# Patient Record
Sex: Female | Born: 1960 | Race: White | Hispanic: No | Marital: Married | Smoking: Former smoker
Health system: Southern US, Community
[De-identification: ages and names within clinical notes are randomized; demographics above are authoritative.]

## PROBLEM LIST (undated history)

## (undated) DIAGNOSIS — T7840XA Allergy, unspecified, initial encounter: Secondary | ICD-10-CM

## (undated) DIAGNOSIS — D649 Anemia, unspecified: Secondary | ICD-10-CM

## (undated) DIAGNOSIS — D509 Iron deficiency anemia, unspecified: Secondary | ICD-10-CM

## (undated) DIAGNOSIS — N6019 Diffuse cystic mastopathy of unspecified breast: Secondary | ICD-10-CM

## (undated) DIAGNOSIS — J45909 Unspecified asthma, uncomplicated: Secondary | ICD-10-CM

## (undated) HISTORY — DX: Iron deficiency anemia, unspecified: D50.9

## (undated) HISTORY — DX: Anemia, unspecified: D64.9

## (undated) HISTORY — DX: Diffuse cystic mastopathy of unspecified breast: N60.19

## (undated) HISTORY — DX: Unspecified asthma, uncomplicated: J45.909

## (undated) HISTORY — PX: COLONOSCOPY: SHX174

## (undated) HISTORY — PX: WRIST SURGERY: SHX841

## (undated) HISTORY — DX: Allergy, unspecified, initial encounter: T78.40XA

---

## 1998-06-14 ENCOUNTER — Inpatient Hospital Stay (HOSPITAL_COMMUNITY): Admission: AD | Admit: 1998-06-14 | Discharge: 1998-06-16 | Payer: Self-pay | Admitting: Obstetrics and Gynecology

## 1998-06-19 ENCOUNTER — Encounter (HOSPITAL_COMMUNITY): Admission: RE | Admit: 1998-06-19 | Discharge: 1998-09-17 | Payer: Self-pay | Admitting: Obstetrics and Gynecology

## 1998-07-13 ENCOUNTER — Other Ambulatory Visit: Admission: RE | Admit: 1998-07-13 | Discharge: 1998-07-13 | Payer: Self-pay | Admitting: Obstetrics and Gynecology

## 1999-06-25 ENCOUNTER — Other Ambulatory Visit: Admission: RE | Admit: 1999-06-25 | Discharge: 1999-06-25 | Payer: Self-pay | Admitting: Obstetrics and Gynecology

## 1999-07-09 ENCOUNTER — Encounter: Payer: Self-pay | Admitting: Obstetrics and Gynecology

## 1999-07-09 ENCOUNTER — Ambulatory Visit (HOSPITAL_COMMUNITY): Admission: RE | Admit: 1999-07-09 | Discharge: 1999-07-09 | Payer: Self-pay | Admitting: Obstetrics and Gynecology

## 1999-10-23 ENCOUNTER — Other Ambulatory Visit: Admission: RE | Admit: 1999-10-23 | Discharge: 1999-10-23 | Payer: Self-pay | Admitting: Obstetrics and Gynecology

## 2001-08-31 ENCOUNTER — Other Ambulatory Visit: Admission: RE | Admit: 2001-08-31 | Discharge: 2001-08-31 | Payer: Self-pay | Admitting: Obstetrics and Gynecology

## 2002-09-21 ENCOUNTER — Other Ambulatory Visit: Admission: RE | Admit: 2002-09-21 | Discharge: 2002-09-21 | Payer: Self-pay | Admitting: Obstetrics and Gynecology

## 2002-12-21 ENCOUNTER — Encounter (INDEPENDENT_AMBULATORY_CARE_PROVIDER_SITE_OTHER): Payer: Self-pay | Admitting: *Deleted

## 2002-12-21 ENCOUNTER — Ambulatory Visit (HOSPITAL_BASED_OUTPATIENT_CLINIC_OR_DEPARTMENT_OTHER): Admission: RE | Admit: 2002-12-21 | Discharge: 2002-12-21 | Payer: Self-pay | Admitting: Surgery

## 2003-11-09 ENCOUNTER — Other Ambulatory Visit: Admission: RE | Admit: 2003-11-09 | Discharge: 2003-11-09 | Payer: Self-pay | Admitting: Obstetrics and Gynecology

## 2004-11-27 ENCOUNTER — Other Ambulatory Visit: Admission: RE | Admit: 2004-11-27 | Discharge: 2004-11-27 | Payer: Self-pay | Admitting: Obstetrics and Gynecology

## 2005-03-26 ENCOUNTER — Encounter: Admission: RE | Admit: 2005-03-26 | Discharge: 2005-03-26 | Payer: Self-pay | Admitting: Sports Medicine

## 2005-04-15 ENCOUNTER — Ambulatory Visit (HOSPITAL_BASED_OUTPATIENT_CLINIC_OR_DEPARTMENT_OTHER): Admission: RE | Admit: 2005-04-15 | Discharge: 2005-04-15 | Payer: Self-pay | Admitting: Orthopedic Surgery

## 2005-06-10 ENCOUNTER — Ambulatory Visit: Payer: Self-pay | Admitting: Internal Medicine

## 2006-11-24 ENCOUNTER — Ambulatory Visit: Payer: Self-pay | Admitting: Internal Medicine

## 2007-05-21 ENCOUNTER — Encounter: Payer: Self-pay | Admitting: Internal Medicine

## 2007-10-07 ENCOUNTER — Emergency Department (HOSPITAL_COMMUNITY): Admission: EM | Admit: 2007-10-07 | Discharge: 2007-10-07 | Payer: Self-pay | Admitting: Family Medicine

## 2008-02-09 DIAGNOSIS — J45909 Unspecified asthma, uncomplicated: Secondary | ICD-10-CM

## 2008-02-09 DIAGNOSIS — J309 Allergic rhinitis, unspecified: Secondary | ICD-10-CM

## 2008-03-28 ENCOUNTER — Ambulatory Visit: Payer: Self-pay | Admitting: Internal Medicine

## 2008-03-28 DIAGNOSIS — F988 Other specified behavioral and emotional disorders with onset usually occurring in childhood and adolescence: Secondary | ICD-10-CM | POA: Insufficient documentation

## 2008-03-28 DIAGNOSIS — F329 Major depressive disorder, single episode, unspecified: Secondary | ICD-10-CM | POA: Insufficient documentation

## 2008-03-30 ENCOUNTER — Telehealth: Payer: Self-pay | Admitting: Internal Medicine

## 2008-04-18 ENCOUNTER — Telehealth: Payer: Self-pay | Admitting: Internal Medicine

## 2008-06-20 ENCOUNTER — Ambulatory Visit: Payer: Self-pay | Admitting: Internal Medicine

## 2008-06-20 LAB — CONVERTED CEMR LAB
Alkaline Phosphatase: 48 units/L (ref 39–117)
Basophils Absolute: 0 10*3/uL (ref 0.0–0.1)
Bilirubin, Direct: 0.1 mg/dL (ref 0.0–0.3)
CO2: 28 meq/L (ref 19–32)
Calcium: 9.1 mg/dL (ref 8.4–10.5)
Cholesterol: 179 mg/dL (ref 0–200)
GFR calc Af Amer: 115 mL/min
Glucose, Bld: 98 mg/dL (ref 70–99)
Glucose, Urine, Semiquant: NEGATIVE
HDL: 43.1 mg/dL (ref >39.0)
LDL Cholesterol: 122 mg/dL — ABNORMAL HIGH (ref 0–99)
Lymphocytes Relative: 13.7 % (ref 12.0–46.0)
MCHC: 33.8 g/dL (ref 30.0–36.0)
Monocytes Absolute: 0.3 10*3/uL (ref 0.1–1.0)
Monocytes Relative: 3.7 % (ref 3.0–12.0)
Neutro Abs: 6.5 10*3/uL (ref 1.4–7.7)
Platelets: 217 10*3/uL (ref 150–400)
Potassium: 4 meq/L (ref 3.5–5.1)
RDW: 13.5 % (ref 11.5–14.6)
Sodium: 142 meq/L (ref 135–145)
Total Bilirubin: 0.4 mg/dL (ref 0.3–1.2)
Total CHOL/HDL Ratio: 4.2
Total Protein: 7 g/dL (ref 6.0–8.3)
Triglycerides: 71 mg/dL (ref 0–149)

## 2008-06-27 ENCOUNTER — Ambulatory Visit: Payer: Self-pay | Admitting: Internal Medicine

## 2008-06-27 ENCOUNTER — Encounter: Payer: Self-pay | Admitting: Internal Medicine

## 2008-06-27 ENCOUNTER — Other Ambulatory Visit: Admission: RE | Admit: 2008-06-27 | Discharge: 2008-06-27 | Payer: Self-pay | Admitting: Internal Medicine

## 2008-06-27 DIAGNOSIS — D649 Anemia, unspecified: Secondary | ICD-10-CM

## 2008-08-08 ENCOUNTER — Encounter: Admission: RE | Admit: 2008-08-08 | Discharge: 2008-08-08 | Payer: Self-pay | Admitting: Internal Medicine

## 2008-09-16 ENCOUNTER — Ambulatory Visit: Payer: Self-pay | Admitting: Internal Medicine

## 2008-09-19 ENCOUNTER — Encounter: Payer: Self-pay | Admitting: Internal Medicine

## 2008-09-19 LAB — CONVERTED CEMR LAB
Basophils Absolute: 0 10*3/uL (ref 0.0–0.1)
Basophils Relative: 0.3 % (ref 0.0–3.0)
Eosinophils Absolute: 0.1 10*3/uL (ref 0.0–0.7)
Eosinophils Relative: 1.1 % (ref 0.0–5.0)
HCT: 32.9 % — ABNORMAL LOW (ref 36.0–46.0)
MCHC: 33.8 g/dL (ref 30.0–36.0)
MCV: 82.7 fL (ref 78.0–100.0)
Monocytes Absolute: 0.4 10*3/uL (ref 0.1–1.0)
Neutro Abs: 4.9 10*3/uL (ref 1.4–7.7)
Neutrophils Relative %: 70.9 % (ref 43.0–77.0)
RBC: 3.98 M/uL (ref 3.87–5.11)
WBC: 6.9 10*3/uL (ref 4.5–10.5)

## 2008-09-23 ENCOUNTER — Ambulatory Visit: Payer: Self-pay | Admitting: Internal Medicine

## 2008-10-10 ENCOUNTER — Telehealth: Payer: Self-pay | Admitting: Internal Medicine

## 2008-10-27 ENCOUNTER — Ambulatory Visit: Payer: Self-pay | Admitting: Internal Medicine

## 2008-12-02 ENCOUNTER — Telehealth: Payer: Self-pay | Admitting: Internal Medicine

## 2008-12-02 ENCOUNTER — Encounter: Payer: Self-pay | Admitting: Internal Medicine

## 2008-12-05 ENCOUNTER — Encounter: Payer: Self-pay | Admitting: Internal Medicine

## 2008-12-08 ENCOUNTER — Ambulatory Visit: Payer: Self-pay | Admitting: Internal Medicine

## 2009-01-02 ENCOUNTER — Telehealth: Payer: Self-pay | Admitting: Internal Medicine

## 2009-01-09 ENCOUNTER — Ambulatory Visit: Payer: Self-pay | Admitting: Internal Medicine

## 2009-01-12 LAB — CONVERTED CEMR LAB
Basophils Absolute: 0 10*3/uL (ref 0.0–0.1)
Basophils Relative: 0.5 % (ref 0.0–3.0)
Eosinophils Absolute: 0.1 10*3/uL (ref 0.0–0.7)
Ferritin: 33 ng/mL (ref 10.0–291.0)
HCT: 39 % (ref 36.0–46.0)
Hemoglobin: 13.4 g/dL (ref 12.0–15.0)
Lymphocytes Relative: 24.2 % (ref 12.0–46.0)
MCHC: 34.3 g/dL (ref 30.0–36.0)
MCV: 89 fL (ref 78.0–100.0)
Monocytes Absolute: 0.3 10*3/uL (ref 0.1–1.0)
Neutro Abs: 3.6 10*3/uL (ref 1.4–7.7)
RBC: 4.38 M/uL (ref 3.87–5.11)
RDW: 12.8 % (ref 11.5–14.6)

## 2009-02-16 ENCOUNTER — Telehealth: Payer: Self-pay | Admitting: Internal Medicine

## 2009-04-05 ENCOUNTER — Telehealth: Payer: Self-pay | Admitting: Internal Medicine

## 2009-04-21 ENCOUNTER — Telehealth: Payer: Self-pay | Admitting: Internal Medicine

## 2009-04-21 ENCOUNTER — Ambulatory Visit: Payer: Self-pay | Admitting: Sports Medicine

## 2009-05-05 ENCOUNTER — Telehealth: Payer: Self-pay | Admitting: Internal Medicine

## 2009-06-05 ENCOUNTER — Telehealth: Payer: Self-pay | Admitting: Internal Medicine

## 2009-06-30 ENCOUNTER — Emergency Department (HOSPITAL_COMMUNITY): Admission: EM | Admit: 2009-06-30 | Discharge: 2009-06-30 | Payer: Self-pay | Admitting: Family Medicine

## 2009-07-12 ENCOUNTER — Ambulatory Visit: Payer: Self-pay | Admitting: Internal Medicine

## 2009-08-07 ENCOUNTER — Telehealth: Payer: Self-pay | Admitting: Internal Medicine

## 2009-09-05 ENCOUNTER — Telehealth: Payer: Self-pay | Admitting: Internal Medicine

## 2009-10-09 ENCOUNTER — Telehealth: Payer: Self-pay | Admitting: Internal Medicine

## 2009-10-12 ENCOUNTER — Telehealth: Payer: Self-pay | Admitting: *Deleted

## 2010-01-09 ENCOUNTER — Telehealth: Payer: Self-pay | Admitting: Internal Medicine

## 2010-02-12 ENCOUNTER — Telehealth: Payer: Self-pay | Admitting: Internal Medicine

## 2010-05-15 ENCOUNTER — Telehealth: Payer: Self-pay | Admitting: Internal Medicine

## 2010-07-16 ENCOUNTER — Telehealth: Payer: Self-pay | Admitting: Internal Medicine

## 2010-07-17 ENCOUNTER — Ambulatory Visit: Payer: Self-pay | Admitting: Internal Medicine

## 2010-07-17 LAB — CONVERTED CEMR LAB
ALT: 19 units/L (ref 0–35)
AST: 21 units/L (ref 0–37)
Alkaline Phosphatase: 39 units/L (ref 39–117)
Basophils Relative: 0.6 % (ref 0.0–3.0)
Bilirubin, Direct: 0.1 mg/dL (ref 0.0–0.3)
Calcium: 8.9 mg/dL (ref 8.4–10.5)
Chloride: 109 meq/L (ref 96–112)
Creatinine, Ser: 0.7 mg/dL (ref 0.4–1.2)
Eosinophils Relative: 1.4 % (ref 0.0–5.0)
HDL: 50.4 mg/dL (ref >39.00)
Hemoglobin: 13.8 g/dL (ref 12.0–15.0)
Ketones, urine, test strip: NEGATIVE
LDL Cholesterol: 120 mg/dL — ABNORMAL HIGH (ref 0–99)
Lymphocytes Relative: 20.5 % (ref 12.0–46.0)
Neutrophils Relative %: 71 % (ref 43.0–77.0)
Nitrite: NEGATIVE
RBC: 4.33 M/uL (ref 3.87–5.11)
Sodium: 141 meq/L (ref 135–145)
Total CHOL/HDL Ratio: 4
Total Protein: 6.3 g/dL (ref 6.0–8.3)
Triglycerides: 56 mg/dL (ref 0.0–149.0)
Urobilinogen, UA: 0.2
WBC Urine, dipstick: NEGATIVE
WBC: 5 10*3/uL (ref 4.5–10.5)

## 2010-07-24 ENCOUNTER — Other Ambulatory Visit: Admission: RE | Admit: 2010-07-24 | Discharge: 2010-07-24 | Payer: Self-pay | Admitting: Internal Medicine

## 2010-07-24 ENCOUNTER — Ambulatory Visit: Payer: Self-pay | Admitting: Internal Medicine

## 2010-07-30 ENCOUNTER — Encounter: Payer: Self-pay | Admitting: Internal Medicine

## 2010-08-09 ENCOUNTER — Encounter: Admission: RE | Admit: 2010-08-09 | Discharge: 2010-08-09 | Payer: Self-pay | Admitting: Internal Medicine

## 2010-08-13 ENCOUNTER — Encounter: Payer: Self-pay | Admitting: Internal Medicine

## 2010-08-14 ENCOUNTER — Encounter: Admission: RE | Admit: 2010-08-14 | Discharge: 2010-08-14 | Payer: Self-pay | Admitting: Internal Medicine

## 2010-08-16 ENCOUNTER — Encounter: Payer: Self-pay | Admitting: Internal Medicine

## 2010-08-21 ENCOUNTER — Telehealth: Payer: Self-pay | Admitting: Internal Medicine

## 2010-08-22 ENCOUNTER — Telehealth: Payer: Self-pay | Admitting: Internal Medicine

## 2010-08-29 ENCOUNTER — Encounter: Payer: Self-pay | Admitting: Internal Medicine

## 2010-09-04 ENCOUNTER — Ambulatory Visit (HOSPITAL_COMMUNITY): Admission: RE | Admit: 2010-09-04 | Discharge: 2010-09-04 | Payer: Self-pay | Admitting: Internal Medicine

## 2010-09-06 ENCOUNTER — Emergency Department (HOSPITAL_COMMUNITY): Admission: EM | Admit: 2010-09-06 | Discharge: 2010-09-06 | Payer: Self-pay | Admitting: Emergency Medicine

## 2010-09-20 ENCOUNTER — Ambulatory Visit: Payer: Self-pay | Admitting: Internal Medicine

## 2010-09-20 DIAGNOSIS — D509 Iron deficiency anemia, unspecified: Secondary | ICD-10-CM

## 2010-09-20 DIAGNOSIS — R1319 Other dysphagia: Secondary | ICD-10-CM

## 2010-09-20 DIAGNOSIS — R1310 Dysphagia, unspecified: Secondary | ICD-10-CM | POA: Insufficient documentation

## 2010-09-20 HISTORY — DX: Iron deficiency anemia, unspecified: D50.9

## 2010-10-23 ENCOUNTER — Telehealth: Payer: Self-pay | Admitting: Internal Medicine

## 2010-10-25 ENCOUNTER — Ambulatory Visit: Payer: Self-pay | Admitting: Internal Medicine

## 2010-11-13 ENCOUNTER — Telehealth: Payer: Self-pay | Admitting: Internal Medicine

## 2011-01-15 NOTE — Progress Notes (Signed)
Summary: REQ FOR REFILL vyvanase  Phone Note Call from Patient   Caller: Patient [phone]) Reason for Call: Refill Medication Summary of Call: Pt called to req a refill on med (VYVANSE 70 MG CAPS)..... Pt adv that she can be reached @ [phone] when same is ready.  Initial call taken by: Debbra Riding,  January 09, 2010 8:53 AM  Follow-up for Phone Call        see Rx.  Will call when signed and ready for pick up.Gladis Riffle, RN  January 09, 2010 9:17 AM   Patient notified.  Follow-up by: Gladis Riffle, RN,  January 09, 2010 9:25 AM    Prescriptions: VYVANSE 70 MG CAPS (LISDEXAMFETAMINE DIMESYLATE) take one tab once daily   fill in two months  #30 x 0   Entered by:   Gladis Riffle, RN   Authorized by:   Birdie Sons MD   Signed by:   Gladis Riffle, RN on 01/09/2010   Method used:   Print then Give to Patient   RxID:   [id number] VYVANSE 70 MG CAPS (LISDEXAMFETAMINE DIMESYLATE) take one tab once daily   fill in one month  #30 x 0   Entered by:   Gladis Riffle, RN   Authorized by:   Birdie Sons MD   Signed by:   Gladis Riffle, RN on 01/09/2010   Method used:   Print then Give to Patient   RxID:   [id number] VYVANSE 70 MG CAPS (LISDEXAMFETAMINE DIMESYLATE) Take 1 tablet by mouth once a day  #30 x 0   Entered by:   Gladis Riffle, RN   Authorized by:   Birdie Sons MD   Signed by:   Gladis Riffle, RN on 01/09/2010   Method used:   Print then Give to Patient   RxID:   [id number]

## 2011-01-15 NOTE — Assessment & Plan Note (Signed)
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 She presents today regarding iron deficiency anemia. This was diagnosed in 2009. Hemoglobin 11.4. Ferritin level 8. Treated with iron replacement. Hemoglobin has normalized. GI review of systems negative except for questionable dysphagia with choking episodes. She denies heartburn, nausea, vomiting, weight loss, melena, hematochezia, or change in bowel habits. Rare NSAID use. Does not donate blood. Still has menstrual periods though irregular. No family history of anemia. No family history of colon cancer. No prior endoscopic evaluations.Marland Kitchen   GI Review of Systems    Reports dysphagia with solids.      Denies abdominal pain, acid reflux, belching, bloating, chest pain, dysphagia with liquids, heartburn, loss of appetite, nausea, vomiting, vomiting blood, weight loss, and  weight gain.        Denies anal fissure, black tarry stools, change in bowel habit, constipation, diarrhea, diverticulosis, fecal incontinence, heme positive stool, hemorrhoids, irritable bowel syndrome, jaundice, light color stool, liver problems, rectal bleeding, and  rectal pain.    Current Medications (verified): 1)  Womens Multivitamin Plus  Tabs (Multiple Vitamins-Minerals) .... Once Daily --Includes Vit D3 2000 Units and Omega 3's 2)  Ferrous Fumarate 325 (106 Fe) Mg Tabs (Ferrous Fumarate) .... Take 1 Tablet By Mouth Two Times A Day  (Does Not Take Regularly) 3)  Ventolin Hfa 108 (90 Base) Mcg/act Aers (Albuterol Sulfate) .Marland Kitchen.. 1-2 Inh Q 4-6 Hrs Prn 4)  Sertraline Hcl 100 Mg Tabs (Sertraline Hcl) .... Take 1 Tablet By Mouth Once A Day 5)  Vyvanse 70 Mg Caps (Lisdexamfetamine Dimesylate) .... Take 1  Tablet By Mouth Once A Day 6)  Triamcinolone Acetonide 0.1 % Crea (Triamcinolone Acetonide) .... Apply Bid  To Affected Area 7)  Vyvanse 70 Mg Caps (Lisdexamfetamine Dimesylate) .... Take One Tab By Mouth Once Daily   Fill in One Month  Allergies (verified): No Known Drug Allergies  Past History:  Past Medical History: Allergic rhinitis Asthma fibrocystic breasts Anemia  Past Surgical History: Reviewed history from 02/09/2008 and no changes required. fx wrist  Family History: Reviewed history from 06/27/2008 and no changes required. father A & W--may have MI based on EKG/CAD in 53s mother alive and sister, breast cancer, s/p radiation 3 siblings  healthy MGF-Pancreatic Cancer  Social History: Reviewed history from 02/09/2008 and no changes required. Occupation:nurse [employer] Married 3 children , set of twins included  healthy 1 stepdaughter--healthy Patient is a former smoker.  Alcohol Use - no Daily Caffeine Use  4 per day  Review of Systems  The patient denies allergy/sinus, anemia, anxiety-new, arthritis/joint pain, back pain, blood in urine, breast changes/lumps, change in vision, confusion, cough, coughing up blood, depression-new, fainting, fatigue, fever, headaches-new, hearing problems, heart murmur, heart rhythm changes, itching, menstrual pain, muscle pains/cramps, night sweats, nosebleeds, pregnancy symptoms, shortness of breath, skin rash, sleeping problems, sore throat, swelling of feet/legs, swollen lymph glands, thirst - excessive , urination - excessive , urination changes/pain, urine leakage, vision changes, and voice change.    Vital Signs:  Patient profile:   50 year old female Menstrual status:  irregular Height:      64 inches Weight:      204 pounds BMI:     35.14 Pulse rate:   84 /  minute Pulse rhythm:   regular BP sitting:   130 / 68  (left arm)  Vitals Entered By: Milford Cage NCMA (September 20, 2010 2:12 PM)  Physical Exam  General:   Well developed, well nourished, no acute distress. Head:  Normocephalic and atraumatic. Eyes:  PERRLA, no icterus. Ears:  Normal auditory acuity. Nose:  No deformity, discharge,  or lesions. Mouth:  No deformity or lesions, dentition normal. Neck:  Supple; no masses or thyromegaly. Lungs:  Clear throughout to auscultation. Heart:  Regular rate and rhythm; no murmurs, rubs,  or bruits. Abdomen:  Soft, nontender and nondistended. No masses, hepatosplenomegaly or hernias noted. Normal bowel sounds. Rectal:  deferred until colonoscopy Msk:  Symmetrical with no gross deformities. Normal posture. Pulses:  Normal pulses noted. Extremities:  No clubbing, cyanosis, edema or deformities noted. Neurologic:  Alert and  oriented x4;  grossly normal neurologically. Skin:  Intact without significant lesions or rashes. Psych:  Alert and cooperative. Normal mood and affect.   Impression & Recommendations:  Problem # 1:  ANEMIA-IRON DEFICIENCY (ICD-280.9) iron deficiency anemia in 2009. Responsive to iron. Suspect secondary to chronic blood loss from menstruation. Should rule out occult GI mucosal lesions.  Problem # 2:  SPECIAL SCREENING FOR MALIGNANT NEOPLASMS COLON (ICD-V76.51) appropriate candidate for colon cancer screening without contraindication. The nature of the procedure as well as the risks, benefits, and alternatives were reviewed. She understood and agreed to proceed. Movi prep prescribed. The patient instructed on its use  Problem # 3:  DYSPHAGIA (ICD-787.29) questionable esophageal dysphagia. Will evaluate further with upper endoscopy. As well can assess for possible upper GI causes for iron deficiency. Would consider duodenal biopsies as well. Discussed with patient.  Other Orders: Colon/Endo (Colon/Endo)  Patient Instructions: 1)  Colon/Endo [facility] 10/25/10 2:30 pm arrive at 1:30 pm  2)  Movi prep instructions given 3)  Movi prep Rx. sent to pharmacy. 4)  Colonoscopy and Flexible  Sigmoidoscopy brochure given.  5)  Upper Endoscopy brochure given.  6)  Copy sent to : Birdie Sons, MD 7)  The medication list was reviewed and reconciled.  All changed / newly prescribed medications were explained.  A complete medication list was provided to the patient / caregiver. Prescriptions: MOVIPREP 100 GM  SOLR (PEG-KCL-NACL-NASULF-NA ASC-C) As per prep instructions.  #1 x 0   Entered by:   Milford Cage NCMA   Authorized by:   Hilarie Fredrickson MD   Signed by:   Milford Cage NCMA on 09/20/2010   Method used:   Electronically to        [id number] (retail)       [address]       [place], [place]  [address]       Ph: [phone] or [phone]       Fax: [phone]   RxID:   [id number]

## 2011-01-15 NOTE — Miscellaneous (Signed)
Summary: Orders Update   Clinical Lists Changes  Orders: Added new Referral order of Gastroenterology Referral (GI) - Signed  Appended Document: Orders Update Orders to [facility] for colonoscopy October 2011.

## 2011-01-15 NOTE — Letter (Signed)
Summary: New Patient letter  [facility]  [address] [place], [place] [address]   Phone: [phone]  Fax: [phone]       08/16/2010 MRN: [id number]  [facility] [address] [place], [place]  [address]  Dear Lindsey Davis,  Welcome to the Gastroenterology Division at [facility].    You are scheduled to see Dr.  Yancey Flemings on Sep 20, 2012 at 1:45pm on the 3rd floor at [facility], [address]  We ask that you try to arrive at our office 15 minutes prior to your appointment time to allow for check-in.  We would like you to complete the enclosed self-administered evaluation form prior to your visit and bring it with you on the day of your appointment.  We will review it with you.  Also, please bring a complete list of all your medications or, if you prefer, bring the medication bottles and we will list them.  Please bring your insurance card so that we may make a copy of it.  If your insurance requires a referral to see a specialist, please bring your referral form from your primary care physician.  Co-payments are due at the time of your visit and may be paid by cash, check or credit card.     Your office visit will consist of a consult with your physician (includes a physical exam), any laboratory testing he/she may order, scheduling of any necessary diagnostic testing (e.g. x-ray, ultrasound, CT-scan), and scheduling of a procedure (e.g. Endoscopy, Colonoscopy) if required.  Please allow enough time on your schedule to allow for any/all of these possibilities.    If you cannot keep your appointment, please call [phone] to cancel or reschedule prior to your appointment date.  This allows [place] the opportunity to schedule an appointment for another patient in need of care.  If you do not cancel or reschedule by 5 p.m. the business day prior to your appointment date, you will be charged a $50.00 late cancellation/no-show fee.    Thank you for choosing  [facility] for your medical needs.  We appreciate the opportunity to care for you.  Please visit [place] at our website  to learn more about our practice.                     Sincerely,                                                             The Gastroenterology Division

## 2011-01-15 NOTE — Progress Notes (Signed)
Summary: REFILL REQUEST  Phone Note Refill Request   Refills Requested: Medication #1:  VYVANSE 70 MG CAPS Take 1 tablet by mouth once a day   Notes: Pt can be reached at [phone] when Rx is ready for p/u.... 90-DAY RX.    Initial call taken by: Debbra Riding,  August 21, 2010 8:18 AM  Follow-up for Phone Call        rx is ready for pick up.  Lindsey Davis is aware Follow-up by: Kern Reap CMA Duncan Dull),  August 21, 2010 11:51 AM    New/Updated Medications: VYVANSE 70 MG CAPS (LISDEXAMFETAMINE DIMESYLATE) take one tab by mouth once daily   fill in one month VYVANSE 70 MG CAPS (LISDEXAMFETAMINE DIMESYLATE) take one tab by mouth once daily   fill in two months Prescriptions: VYVANSE 70 MG CAPS (LISDEXAMFETAMINE DIMESYLATE) take one tab by mouth once daily   fill in two months  #30 x 0   Entered by:   Kern Reap CMA (AAMA)   Authorized by:   Birdie Sons MD   Signed by:   Kern Reap CMA (AAMA) on 08/21/2010   Method used:   Print then Give to Lindsey Davis   RxID:   [id number] VYVANSE 70 MG CAPS (LISDEXAMFETAMINE DIMESYLATE) take one tab by mouth once daily   fill in one month  #30 x 0   Entered by:   Kern Reap CMA (AAMA)   Authorized by:   Birdie Sons MD   Signed by:   Kern Reap CMA (AAMA) on 08/21/2010   Method used:   Print then Give to Lindsey Davis   RxID:   [id number] VYVANSE 70 MG CAPS (LISDEXAMFETAMINE DIMESYLATE) Take 1 tablet by mouth once a day  #30 x 0   Entered by:   Kern Reap CMA (AAMA)   Authorized by:   Birdie Sons MD   Signed by:   Kern Reap CMA (AAMA) on 08/21/2010   Method used:   Print then Give to Lindsey Davis   RxID:   [id number]

## 2011-01-15 NOTE — Progress Notes (Signed)
  Medications Added MOVIPREP 100 GM  SOLR (PEG-KCL-NACL-NASULF-NA ASC-C) As per prep instructions.       ---- Converted from flag ---- ---- 10/22/2010 8:12 AM, Karna Christmas wrote: Pt. needs her Moviprep sent to [facility]  # [phone] ------------------------------  Phone Note Call from Patient      New/Updated Medications: MOVIPREP 100 GM  SOLR (PEG-KCL-NACL-NASULF-NA ASC-C) As per prep instructions. Prescriptions: MOVIPREP 100 GM  SOLR (PEG-KCL-NACL-NASULF-NA ASC-C) As per prep instructions.  #1 x 0   Entered by:   Milford Cage NCMA   Authorized by:   Hilarie Fredrickson MD   Signed by:   Milford Cage NCMA on 10/23/2010   Method used:   Electronically to        [facility]* (retail)       [address]       [address] [employer]       [place], [place]  [address]       Ph: [phone]       Fax: [phone]   RxID:   [id number]

## 2011-01-15 NOTE — Progress Notes (Signed)
Summary: refill  Phone Note Refill Request Call back at [phone] Message from:  Patient---live call  Refills Requested: Medication #1:  VYVANSE 70 MG CAPS take one tab by mouth once daily   fill in one month call pt when ready. Her parents will pick up rx.  Initial call taken by: Warnell Forester,  November 13, 2010 8:58 AM  Follow-up for Phone Call        pt will be pick up #90, rx up front ready for p/u, pt aware Follow-up by: Alfred Levins, CMA,  November 13, 2010 9:34 AM    Prescriptions: VYVANSE 70 MG CAPS (LISDEXAMFETAMINE DIMESYLATE) Take 1 tablet by mouth once a day  #90 x 0   Entered by:   Alfred Levins, CMA   Authorized by:   Birdie Sons MD   Signed by:   Alfred Levins, CMA on 11/13/2010   Method used:   Print then Give to Patient   RxID:   [id number] VYVANSE 70 MG CAPS (LISDEXAMFETAMINE DIMESYLATE) Take 1 tablet by mouth once a day  #30 x 0   Entered by:   Alfred Levins, CMA   Authorized by:   Birdie Sons MD   Signed by:   Alfred Levins, CMA on 11/13/2010   Method used:   Print then Give to Patient   RxID:   [id number]

## 2011-01-15 NOTE — Progress Notes (Signed)
Summary: REQ FOR RX vyvanase  Phone Note Call from Patient   Caller: Patient [phone] Reason for Call: Refill Medication Summary of Call: Pt called to req a refill on med (VYVANSE 70 MG CAPS)..... Pt adv that she can be reached @ [phone] when same is ready...Marland KitchenMarland Kitchen Pt adv that the pharmacy [facility])  will only take one RX with the 90-day quantitiy so she can get her 3 mth supply... Pt would like one script for 90-day supply  instead of 3 separate scripts.  Initial call taken by: Debbra Riding,  February 12, 2010 11:27 AM  Follow-up for Phone Call        see Rx.Gladis Riffle, RN  February 13, 2010 5:02 PM   Patient notified it is ready for pick up. Follow-up by: Gladis Riffle, RN,  February 14, 2010 8:27 AM    Prescriptions: VYVANSE 70 MG CAPS (LISDEXAMFETAMINE DIMESYLATE) Take 1 tablet by mouth once a day  #90 x 0   Entered by:   Gladis Riffle, RN   Authorized by:   Birdie Sons MD   Signed by:   Gladis Riffle, RN on 02/13/2010   Method used:   Print then Give to Patient   RxID:   [id number]

## 2011-01-15 NOTE — Letter (Signed)
Summary: [facility] Instructions  [facility]  [address] [place], [place] [address]   Phone: [phone]  Fax: [phone]       Lindsey Davis    08-23-1961    MRN: [id number]        Procedure Day /Date:THURSDAY, 10/25/10     Arrival Time:1:30 PM     Procedure Time:2:30 PM     Location of Procedure:                    [facility] (4th Floor)    PREPARATION FOR COLONOSCOPY WITH MOVIPREP/ENDO   Starting 5 days prior to your procedure 10/20/10 do not eat nuts, seeds, popcorn, corn, beans, peas,  salads, or any raw vegetables.  Do not take any fiber supplements (e.g. Metamucil, Citrucel, and Benefiber).  THE DAY BEFORE YOUR PROCEDURE         DATE: 10/24/10  DAY: WEDNESDAY  1.  Drink clear liquids the entire day-NO SOLID FOOD  2.  Do not drink anything colored red or purple.  Avoid juices with pulp.  No orange juice.  3.  Drink at least 64 oz. (8 glasses) of fluid/clear liquids during the day to prevent dehydration and help the prep work efficiently.  CLEAR LIQUIDS INCLUDE: Water Jello Ice Popsicles Tea (sugar ok, no milk/cream) Powdered fruit flavored drinks Coffee (sugar ok, no milk/cream) Gatorade Juice: apple, white grape, white cranberry  Lemonade Clear bullion, consomm, broth Carbonated beverages (any kind) Strained chicken noodle soup Hard Candy                             4.  In the morning, mix first dose of MoviPrep solution:    Empty 1 Pouch A and 1 Pouch B into the disposable container    Add lukewarm drinking water to the top line of the container. Mix to dissolve    Refrigerate (mixed solution should be used within 24 hrs)  5.  Begin drinking the prep at 5:00 p.m. The MoviPrep container is divided by 4 marks.   Every 15 minutes drink the solution down to the next mark (approximately 8 oz) until the full liter is complete.   6.  Follow completed prep with 16 oz of clear liquid of your choice (Nothing red or purple).   Continue to drink clear liquids until bedtime.  7.  Before going to bed, mix second dose of MoviPrep solution:    Empty 1 Pouch A and 1 Pouch B into the disposable container    Add lukewarm drinking water to the top line of the container. Mix to dissolve    Refrigerate  THE DAY OF YOUR PROCEDURE      DATE: 10/25/10 DAY: THURSDAY Beginning at 9:30 a.m. (5 hours before procedure):         1. Every 15 minutes, drink the solution down to the next mark (approx 8 oz) until the full liter is complete.  2. Follow completed prep with 16 oz. of clear liquid of your choice.    3. You may drink clear liquids until 12:30 PM (2 HOURS BEFORE PROCEDURE).   MEDICATION INSTRUCTIONS  Unless otherwise instructed, you should take regular prescription medications with a small sip of water   as early as possible the morning of your procedure.         OTHER INSTRUCTIONS  You will need a responsible adult at least 51 years of age to  accompany you and drive you home.   This person must remain in the waiting room during your procedure.  Wear loose fitting clothing that is easily removed.  Leave jewelry and other valuables at home.  However, you may wish to bring a book to read or  an iPod/MP3 player to listen to music as you wait for your procedure to start.  Remove all body piercing jewelry and leave at home.  Total time from sign-in until discharge is approximately 2-3 hours.  You should go home directly after your procedure and rest.  You can resume normal activities the  day after your procedure.  The day of your procedure you should not:   Drive   Make legal decisions   Operate machinery   Drink alcohol   Return to work  You will receive specific instructions about eating, activities and medications before you leave.    The above instructions have been reviewed and explained to me by   _______________________    I fully understand and can verbalize these instructions  _____________________________ Date _________

## 2011-01-15 NOTE — Progress Notes (Signed)
  Phone Note Call from Patient   Summary of Call: Pt walked in asking for a 90 day supply of Vyvanse for insurance purposes. Initial call taken by: Lynann Beaver CMA,  August 22, 2010 8:34 AM    New/Updated Medications: VYVANSE 70 MG CAPS (LISDEXAMFETAMINE DIMESYLATE) take one tab by mouth once daily Prescriptions: VYVANSE 70 MG CAPS (LISDEXAMFETAMINE DIMESYLATE) take one tab by mouth once daily  #90 x 0   Entered by:   Lynann Beaver CMA   Authorized by:   Birdie Sons MD   Signed by:   Lynann Beaver CMA on 08/22/2010   Method used:   Print then Give to Patient   RxID:   [id number]

## 2011-01-15 NOTE — Procedures (Signed)
Summary: Colonoscopy  Patient: Lindsey Davis Note: All result statuses are Final unless otherwise noted.  Tests: (1) Colonoscopy (COL)   COL Colonoscopy           DONE     [facility]     [address]     [place], [place]  [address]           COLONOSCOPY PROCEDURE REPORT           PATIENT:  Lindsey, Davis  MR#:  [id number]     BIRTHDATE:  Aug 18, 1961, 49 yrs. old  GENDER:  female     ENDOSCOPIST:  Wilhemina Bonito. Eda Keys, MD     REF. BY:  Birdie Sons, M.D.     PROCEDURE DATE:  10/25/2010     PROCEDURE:  Average-risk screening colonoscopy     G0121     ASA CLASS:  Class I     INDICATIONS:  colorectal cancer screening, average risk     MEDICATIONS:   Fentanyl 150 mcg IV, Versed 15 mg IV, Benadryl 50     mg IV           DESCRIPTION OF PROCEDURE:   After the risks benefits and     alternatives of the procedure were thoroughly explained, informed     consent was obtained.  Digital rectal exam was performed and     revealed no abnormalities.   The LB 180AL [id number] endoscope was     introduced through the anus and advanced to the cecum, which was     identified by both the appendix and ileocecal valve, without     limitations.Time to cecum = 5:54 min.  The quality of the prep was     good, using MoviPrep.  The instrument was then slowly withdrawn     (time = 17:20 min.) as the colon was fully examined.     <<PROCEDUREIMAGES>>           FINDINGS:  The terminal ileum appeared normal.  A normal appearing     cecum, ileocecal valve, and appendiceal orifice were identified.     The ascending, hepatic flexure, transverse, splenic flexure,     descending, sigmoid colon, and rectum appeared unremarkable.     Retroflexed views in the rectum revealed no abnormalities.    The     scope was then withdrawn from the patient and the procedure     completed.           COMPLICATIONS:  None     ENDOSCOPIC IMPRESSION:     1) Normal terminal ileum     2) Normal colon           RECOMMENDATIONS:     1) Continue current colorectal screening recommendations for     "routine risk" patients with a repeat colonoscopy in 10 years.     2) EGD today           ______________________________     Wilhemina Bonito. Eda Keys, MD           CC:  [name], MD; The Patient           n.     eSIGNED:   Wilhemina Bonito. Eda Keys at 10/25/2010 03:02 PM           Prudy Feeler, [phone]  Note: An exclamation mark (!) indicates a result that was not dispersed into the flowsheet. Document Creation Date: 10/25/2010 3:03 PM _______________________________________________________________________  (1) Order result status:  Final Collection or observation date-time: 10/25/2010 14:57 Requested date-time:  Receipt date-time:  Reported date-time:  Referring Physician:   Ordering Physician: Fransico Setters [phone]) Specimen Source:  Source: Launa Grill Order Number: [id number] Lab site:   Appended Document: Colonoscopy    Clinical Lists Changes  Observations: Added new observation of COLONNXTDUE: 10/2020 (10/25/2010 15:59)

## 2011-01-15 NOTE — Progress Notes (Signed)
Summary: refill  Phone Note Refill Request Call back at Home Phone [phone] Message from:  Patient---live call  Refills Requested: Medication #1:  VYVANSE 70 MG CAPS Take 1 tablet by mouth once a day. needs #90.   call pt when ready.  Initial call taken by: Warnell Forester,  May 15, 2010 8:37 AM    Prescriptions: VYVANSE 70 MG CAPS (LISDEXAMFETAMINE DIMESYLATE) Take 1 tablet by mouth once a day  #60 x 0   Entered by:   Lynann Beaver CMA   Authorized by:   Birdie Sons MD   Signed by:   Lynann Beaver CMA on 05/15/2010   Method used:   Print then Give to Patient   RxID:   [id number]

## 2011-01-15 NOTE — Procedures (Signed)
Summary: Upper Endoscopy  Patient: Lindsey Davis Note: All result statuses are Final unless otherwise noted.  Tests: (1) Upper Endoscopy (EGD)   EGD Upper Endoscopy       DONE     [facility]     [address]     [place], [place]  [address]           ENDOSCOPY PROCEDURE REPORT           PATIENT:  Lindsey, Davis  MR#:  [id number]     BIRTHDATE:  [birth year], 49 yrs. old  GENDER:  female           ENDOSCOPIST:  Wilhemina Bonito. Eda Keys, MD     Referred by:  Birdie Sons, M.D.           PROCEDURE DATE:  10/25/2010     PROCEDURE:  EGD, diagnostic 43235     ASA CLASS:  Class I     INDICATIONS:  iron deficiency anemia (resolved w/ oral iron),     dysphagia (vague)           MEDICATIONS:   There was residual sedation effect present from     prior procedure.     TOPICAL ANESTHETIC:  Exactacain Spray           DESCRIPTION OF PROCEDURE:   After the risks benefits and     alternatives of the procedure were thoroughly explained, informed     consent was obtained.  The LB GIF-H180 [id number] endoscope was     introduced through the mouth and advanced to the third portion of     the duodenum, without limitations.  The instrument was slowly     withdrawn as the mucosa was fully examined.     <<PROCEDUREIMAGES>>           The upper, middle, and distal third of the esophagus were     carefully inspected and no abnormalities were noted. The z-line     was well seen at the GEJ. The endoscope was pushed into the fundus     which was normal including a retroflexed view. The antrum,gastric     body, first and second part of the duodenum were unremarkable.     Retroflexed views revealed no abnormalities.    The scope was then     withdrawn from the patient and the procedure completed.           COMPLICATIONS:  None           ENDOSCOPIC IMPRESSION:     1) Normal EGD     2) Suspect prior problem with iron deficiency anemia due to     menstrual blood loss           RECOMMENDATIONS:     1)  Continue iron supplement as long as you menstruate     2) Follow-up prn           ______________________________     Wilhemina Bonito. Eda Keys, MD           CC:  [name], MD, The Patient           n.     eSIGNEDWilhemina Bonito. Eda Keys at 10/25/2010 03:15 PM           Prudy Feeler, [phone]  Note: An exclamation mark (!) indicates a result that was not dispersed into the flowsheet. Document Creation Date: 10/25/2010 3:16 PM _______________________________________________________________________  (1) Order result status:  Final Collection or observation date-time: 10/25/2010 15:07 Requested date-time:  Receipt date-time:  Reported date-time:  Referring Physician:   Ordering Physician: Fransico Setters [phone]) Specimen Source:  Source: Launa Grill Order Number: [id number] Lab site:

## 2011-01-15 NOTE — Assessment & Plan Note (Signed)
Summary: cpx/pap/njr   Vital Signs:  Patient profile:   50 year old female Menstrual status:  irregular LMP:     05/29/2010 Height:      64 inches Weight:      201 pounds BMI:     34.63 Pulse rate:   92 / minute Pulse rhythm:   regular Resp:     12 per minute BP sitting:   112 / 70  (left arm) Cuff size:   regular  Vitals Entered By: Gladis Riffle, RN (July 24, 2010 8:45 AM)  Nutrition Counseling: Patient's BMI is greater than 25 and therefore counseled on weight management options. CC: cpx with pap, labs done--periods irregular with last in June and prior in Feb Is Patient Diabetic? No LMP (date): 05/29/2010     Menstrual Status irregular Enter LMP: 05/29/2010   CC:  cpx with pap and labs done--periods irregular with last in June and prior in Feb.  History of Present Illness: cpx has gained some weight  Preventive Screening-Counseling & Management  Alcohol-Tobacco     Smoking Status: quit  Current Problems (verified): 1)  Anemia, Other Unspec  (ICD-285.9) 2)  Preventive Health Care  (ICD-V70.0) 3)  Depression  (ICD-311) 4)  Add  (ICD-314.00) 5)  Asthma  (ICD-493.90) 6)  Allergic Rhinitis  (ICD-477.9)  Current Medications (verified): 1)  Womens Multivitamin Plus  Tabs (Multiple Vitamins-Minerals) .... Once Daily --Includes Vit D3 2000 Units and Omega 3's 2)  Ferrous Fumarate 325 (106 Fe) Mg Tabs (Ferrous Fumarate) .... Take 1 Tablet By Mouth Two Times A Day 3)  Ventolin Hfa 108 (90 Base) Mcg/act Aers (Albuterol Sulfate) .Marland Kitchen.. 1-2 Inh Q 4-6 Hrs Prn 4)  Rhinocort Aqua 32 Mcg/act Susp (Budesonide (Nasal)) .Marland Kitchen.. 1-2 Sprays in Each Nostril Once Daily  Dispense Qs 1 Month 5)  Flovent Hfa 110 Mcg/act Aero (Fluticasone Propionate  Hfa) .Marland Kitchen.. 1 Inh Two Times A Day 6)  Sertraline Hcl 100 Mg Tabs (Sertraline Hcl) .... Take 1 Tablet By Mouth Once A Day 7)  Vyvanse 70 Mg Caps (Lisdexamfetamine Dimesylate) .... 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   Head:  normocephalic and atraumatic.   Eyes:  pupils equal and pupils round.   Ears:  R ear normal and L ear normal.   Neck:  no carotid bruits or lymphadenopathy Lungs:  normal respiratory effort and no intercostal retractions.   Heart:  normal rate and regular rhythm.   Abdomen:  Bowel sounds positive,abdomen soft and non-tender without masses, organomegaly or hernias noted. Genitalia:  normal introitus, no external lesions, and no vaginal discharge.  pap done Msk:  No deformity or scoliosis noted of thoracic or lumbar spine.   Extremities:  No clubbing, cyanosis, edema, or deformity noted  Neurologic:  cranial nerves II-XII intact and gait normal.   Skin:  turgor normal and color normal.   Psych:  good eye contact and not anxious appearing.  Impression & Recommendations:  Problem # 1:  PREVENTIVE HEALTH CARE (ICD-V70.0)  advised regular exercise advised weight loss  she has noted some menopausal sxs: rare hot flash, intermittent disturbed sleep  Orders: EKG w/ Interpretation (93000)  Problem # 2:  ANEMIA, OTHER UNSPEC (ICD-285.9) resolved Her updated medication list for this problem includes:    Ferrous Fumarate 325 (106 Fe) Mg Tabs (Ferrous fumarate) .Marland Kitchen... Take 1 tablet by mouth two times a day  Complete Medication List: 1)  Womens Multivitamin Plus Tabs (Multiple  vitamins-minerals) .... Once daily --includes vit d3 2000 units and omega 3's 2)  Ferrous Fumarate 325 (106 Fe) Mg Tabs (Ferrous fumarate) .... Take 1 tablet by mouth two times a day 3)  Ventolin Hfa 108 (90 Base) Mcg/act Aers (Albuterol sulfate) .Marland Kitchen.. 1-2 inh q 4-6 hrs prn 4)  Rhinocort Aqua 32 Mcg/act Susp (Budesonide (nasal)) .Marland Kitchen.. 1-2 sprays in each nostril once daily  dispense qs 1 month 5)  Flovent Hfa 110 Mcg/act Aero (Fluticasone propionate  hfa) .Marland Kitchen.. 1 inh two times a day 6)  Sertraline Hcl 100 Mg Tabs (Sertraline hcl) .... Take 1 tablet by mouth once a day 7)  Vyvanse 70 Mg Caps (Lisdexamfetamine dimesylate) .... Take 1 tablet by mouth once a day 8)  Triamcinolone Acetonide 0.1 % Crea (Triamcinolone acetonide) .... Apply bid  to affected area Prescriptions: TRIAMCINOLONE ACETONIDE 0.1 % CREA (TRIAMCINOLONE ACETONIDE) apply bid  to affected area  #30 grams x 1   Entered and Authorized by:   Birdie Sons MD   Signed by:   Birdie Sons MD on 07/24/2010   Method used:   Electronically to        [facility]* (retail)       [address]       [address]mail[address]       [place], [place]  [address]       Ph: [phone]       Fax: [phone]   RxID:   [id number] SERTRALINE HCL 100 MG TABS (SERTRALINE HCL) Take 1 tablet by mouth once a day  #90 x 3   Entered by:   Gladis Riffle, RN   Authorized by:   Birdie Sons MD   Signed by:   Gladis Riffle, RN on 07/24/2010   Method used:   Electronically to        [facility]* (retail)       [address]       [address] Shipping/mailing       [place], [place]  [address]       Ph: [phone]       Fax: [phone]   RxID:   [id number] FERROUS FUMARATE 325 (106 FE) MG TABS (FERROUS FUMARATE) Take 1 tablet by mouth two times a day  #200 x 1   Entered by:   Gladis Riffle, RN   Authorized by:   Birdie Sons MD   Signed by:   Gladis Riffle, RN on 07/24/2010   Method used:   Electronically to         [facility]* (retail)       [address]       [address] Shipping/mailing       [place], [place]  [address]       Ph: [phone]       Fax: [phone]   RxID:   [id number]

## 2011-01-15 NOTE — Progress Notes (Signed)
Summary:  vyvance and question  Phone Note Call from Patient Call back at [facility] Phone [phone] Call back at Work Phone [phone]   Caller: Patient Call For: Birdie Sons MD Summary of Call: pt needs new rx vyvanse 70 mg Initial call taken by: Heron Sabins,  July 16, 2010 11:14 AM  Follow-up for Phone Call        Lat seen 07/12/09.  Pt is aware and has made appt for August.  Having labs done 07/17/10.  Wants to know if hormone level can be done as no period since June. Follow-up by: Gladis Riffle, RN,  July 16, 2010 2:28 PM  Additional Follow-up for Phone Call Additional follow up Details #1::        ok , check [facility] Additional Follow-up by: Birdie Sons MD,  July 16, 2010 3:59 PM    Additional Follow-up for Phone Call Additional follow up Details #2::    added to lab request for 07/17/10. Follow-up by: Gladis Riffle, RN,  July 16, 2010 4:09 PM  Prescriptions: VYVANSE 70 MG CAPS (LISDEXAMFETAMINE DIMESYLATE) Take 1 tablet by mouth once a day  #30 x 0   Entered by:   Gladis Riffle, RN   Authorized by:   Birdie Sons MD   Signed by:   Gladis Riffle, RN on 07/16/2010   Method used:   Print then Give to Patient   RxID:   [id number]

## 2011-02-08 ENCOUNTER — Other Ambulatory Visit: Payer: Self-pay | Admitting: Internal Medicine

## 2011-02-08 DIAGNOSIS — Z09 Encounter for follow-up examination after completed treatment for conditions other than malignant neoplasm: Secondary | ICD-10-CM

## 2011-02-20 ENCOUNTER — Ambulatory Visit
Admission: RE | Admit: 2011-02-20 | Discharge: 2011-02-20 | Disposition: A | Discharge status: Home / Self Care | Payer: Commercial Managed Care - PPO | Source: Ambulatory Visit | Attending: Internal Medicine | Admitting: Internal Medicine

## 2011-02-20 DIAGNOSIS — Z09 Encounter for follow-up examination after completed treatment for conditions other than malignant neoplasm: Secondary | ICD-10-CM

## 2011-05-03 NOTE — Op Note (Signed)
Lindsey, Davis NO.:  [id number]   MEDICAL RECORD NO.:  [id number]          PATIENT TYPE:  AMB   LOCATION:  DSC                          FACILITY:  [facility]   PHYSICIAN:  Loreta Ave, M.D. DATE OF BIRTH:  July 05, 1961   DATE OF PROCEDURE:  04/15/2005  DATE OF DISCHARGE:                                 OPERATIVE REPORT   PREOPERATIVE DIAGNOSIS:  Displaced impacted angulated right distal radius  fracture, with partial healing.   POSTOPERATIVE DIAGNOSIS:  Displaced impacted angulated right distal radius  fracture, with partial healing.   OPERATION:  Takedown of early malunion, followed by open reduction and  internal fixation of right distal radius fracture with a Synthes volar plate  and screws.   SURGEON:  Loreta Ave, M.D.   ASSISTANT:  Genene Churn. Denton Meek.   ANESTHESIA:  General.   ESTIMATED BLOOD LOSS:  Minimal.   TOURNIQUET TIME:  One hour.   SPECIMENS:  None.   CULTURE:  None.   COMPLICATIONS:  None.   DRESSING:  Soft compressive with a well-padded short-arm splint.   DESCRIPTION OF PROCEDURE:  The patient is brought to the operating room and  placed on the operating room table in the supine position.  After adequate  anesthesia had been obtained, the tourniquet was applied to the upper aspect  of the right arm.  She was prepped and draped in the usual sterile fashion.  Ensanguined with elevation of the Esmarch.  The tourniquet inflated to 250  mmHg.  The fracture of the distal radius which was angulated dorsally,  shortened and impacted was really not amenable to manipulation.  The  incision in the volar radial aspect of the radius.  The skin and  subcutaneous tissues were divided.  The subperiosteal exposure of the radius  and the fracture, protecting the neurovascular structures throughout.  Osteotomes were used to re-create the fracture and create sufficient  mobility to reduce this.  Done under visual as well as fluoroscopic  guidance.  Once I had good mobility, I could elevate the radius back up and  restore volar tilt, radial height, and length, as well as a good distal  radial ulnar relationship.  Held in place and then affixed with a volar-  placed six-hole plate, three proximal screws, three distal screws.  All pre-  drilled, tapped and counter-sunk.  At completion, care was taken to be sure  that fixation was adequate throughout, and none of the screws were in the  joint.  Overall alignment was excellent in terms of restoring the height and  position of the distal radius.  Wound irrigated.  Closed with subcutaneous  and subcuticular Vicryl  and Steri-Strips.  The margins of the wound were injected with Marcaine  without epinephrine.  A sterile compressive dressing and short-arm well-  padded splint were applied.  The anesthesia was reversed.   The patient was brought to the recovery room.  The patient tolerated the  surgery well with no complications.      DFM/MEDQ  D:  04/15/2005  T:  04/15/2005  Job:  [id number] 

## 2011-05-03 NOTE — Op Note (Signed)
   Lindsey Davis, Lindsey Davis                             ACCOUNT NO.:  [id number]   MEDICAL RECORD NO.:  [id number]                   PATIENT TYPE:  AMB   LOCATION:  DSC                                  FACILITY:  [facility]   PHYSICIAN:  Thornton Park. Daphine Deutscher, M.D.             DATE OF BIRTH:  [birth year]   DATE OF PROCEDURE:  DATE OF DISCHARGE:                                 OPERATIVE REPORT   PROCEDURE:  Excision of mass, left posterior head and scalp.   DESCRIPTION OF PROCEDURE:  Lindsey Davis was taken to room 7 and the area in  question was taped off and then painted with Betadine and draped sterilely.  I infiltrated it with 1% Lidocaine with epinephrine.  I made an incision  transversely and painted it again with Betadine and then cut down to this  cystic mass.  I got into pseudo capsule and used that to get around it, and  then I was able to enucleate it nicely and it came out in toto.  The  resulting wound was then irrigated.  I first tried placing subcutaneous  sutures, but this tended to pull hair into the wound, I ended up closing  with four sutures of Prolene, using 4-0 Prolene and approximating it.  There  was no bleeding noted.  The sterile dressing was applied and she was taken  to the recovery room.  She will be given Vicodin if needed for pain and was  asked to return to the office in seven days for suture removal.                                               Molli Hazard B. Daphine Deutscher, M.D.    MBM/MEDQ  D:  12/21/2002  T:  12/21/2002  Job:  [id number]

## 2011-07-25 ENCOUNTER — Other Ambulatory Visit: Payer: Self-pay | Admitting: Internal Medicine

## 2011-07-25 DIAGNOSIS — Z1231 Encounter for screening mammogram for malignant neoplasm of breast: Secondary | ICD-10-CM

## 2011-08-16 ENCOUNTER — Ambulatory Visit
Admission: RE | Admit: 2011-08-16 | Discharge: 2011-08-16 | Disposition: A | Discharge status: Home / Self Care | Payer: Commercial Managed Care - PPO | Source: Ambulatory Visit | Attending: Internal Medicine | Admitting: Internal Medicine

## 2011-08-16 DIAGNOSIS — Z1231 Encounter for screening mammogram for malignant neoplasm of breast: Secondary | ICD-10-CM

## 2012-01-21 ENCOUNTER — Other Ambulatory Visit: Payer: Self-pay | Admitting: Internal Medicine

## 2012-08-31 ENCOUNTER — Other Ambulatory Visit: Payer: Self-pay | Admitting: Internal Medicine

## 2012-08-31 DIAGNOSIS — Z1231 Encounter for screening mammogram for malignant neoplasm of breast: Secondary | ICD-10-CM

## 2012-09-17 ENCOUNTER — Ambulatory Visit
Admission: RE | Admit: 2012-09-17 | Discharge: 2012-09-17 | Disposition: A | Discharge status: Home / Self Care | Payer: 59 | Source: Ambulatory Visit | Attending: Internal Medicine | Admitting: Internal Medicine

## 2012-09-17 DIAGNOSIS — Z1231 Encounter for screening mammogram for malignant neoplasm of breast: Secondary | ICD-10-CM

## 2013-01-07 ENCOUNTER — Encounter: Payer: Self-pay | Admitting: Internal Medicine

## 2013-03-18 ENCOUNTER — Other Ambulatory Visit (INDEPENDENT_AMBULATORY_CARE_PROVIDER_SITE_OTHER): Payer: 59

## 2013-03-18 ENCOUNTER — Encounter: Payer: Self-pay | Admitting: Internal Medicine

## 2013-03-18 DIAGNOSIS — Z0000 Encounter for general adult medical examination without abnormal findings: Secondary | ICD-10-CM

## 2013-03-18 LAB — CBC WITH DIFFERENTIAL/PLATELET
Eosinophils Relative: 2.2 % (ref 0.0–5.0)
HCT: 40.1 % (ref 36.0–46.0)
Lymphs Abs: 1.2 10*3/uL (ref 0.7–4.0)
MCV: 88 fl (ref 78.0–100.0)
Monocytes Absolute: 0.3 10*3/uL (ref 0.1–1.0)
Platelets: 195 10*3/uL (ref 150.0–400.0)
RDW: 13.1 % (ref 11.5–14.6)
WBC: 3.3 10*3/uL — ABNORMAL LOW (ref 4.5–10.5)

## 2013-03-18 LAB — TSH: TSH: 1.61 u[IU]/mL (ref 0.35–5.50)

## 2013-03-18 LAB — BASIC METABOLIC PANEL
BUN: 16 mg/dL (ref 6–23)
CO2: 28 mEq/L (ref 19–32)
Chloride: 103 mEq/L (ref 96–112)
Glucose, Bld: 87 mg/dL (ref 70–99)
Potassium: 4.7 mEq/L (ref 3.5–5.1)

## 2013-03-18 LAB — POCT URINALYSIS DIPSTICK
Nitrite, UA: NEGATIVE
Urobilinogen, UA: 0.2
pH, UA: 5.5

## 2013-03-18 LAB — HEPATIC FUNCTION PANEL
ALT: 41 U/L — ABNORMAL HIGH (ref 0–35)
Total Bilirubin: 0.2 mg/dL — ABNORMAL LOW (ref 0.3–1.2)

## 2013-03-25 ENCOUNTER — Other Ambulatory Visit (HOSPITAL_COMMUNITY)
Admission: RE | Admit: 2013-03-25 | Discharge: 2013-03-25 | Disposition: A | Discharge status: Home / Self Care | Payer: 59 | Source: Ambulatory Visit | Attending: Family | Admitting: Family

## 2013-03-25 ENCOUNTER — Encounter: Payer: Self-pay | Admitting: Family

## 2013-03-25 ENCOUNTER — Ambulatory Visit (INDEPENDENT_AMBULATORY_CARE_PROVIDER_SITE_OTHER): Payer: 59 | Admitting: Family

## 2013-03-25 VITALS — BP 130/90 | HR 76 | Ht 64.5 in | Wt 226.0 lb

## 2013-03-25 DIAGNOSIS — Z124 Encounter for screening for malignant neoplasm of cervix: Secondary | ICD-10-CM

## 2013-03-25 DIAGNOSIS — Z0000 Encounter for general adult medical examination without abnormal findings: Secondary | ICD-10-CM

## 2013-03-25 DIAGNOSIS — F329 Major depressive disorder, single episode, unspecified: Secondary | ICD-10-CM

## 2013-03-25 DIAGNOSIS — Z01419 Encounter for gynecological examination (general) (routine) without abnormal findings: Secondary | ICD-10-CM | POA: Insufficient documentation

## 2013-03-25 DIAGNOSIS — Z23 Encounter for immunization: Secondary | ICD-10-CM

## 2013-03-25 DIAGNOSIS — F988 Other specified behavioral and emotional disorders with onset usually occurring in childhood and adolescence: Secondary | ICD-10-CM

## 2013-03-25 MED ORDER — FLUTICASONE PROPIONATE 50 MCG/ACT NA SUSP: 2.0000 | Freq: Every day | NASAL | Status: DC

## 2013-03-25 MED ORDER — PHENTERMINE HCL 37.5 MG PO CAPS: 37.5000 mg | ORAL_CAPSULE | ORAL | Status: DC

## 2013-03-25 NOTE — Progress Notes (Signed)
Subjective:    Patient ID: Lindsey Davis, female    DOB: [birth year], 52 y.o.   MRN: [id number]  HPI  This is a routine physical examination for this healthy  Female. Reviewed all health maintenance protocols including mammography colonoscopy bone density and reviewed appropriate screening labs. Her immunization history was reviewed as well as her current medications and allergies refills of her chronic medications were given and the plan for yearly health maintenance was discussed all orders and referrals were made as appropriate.   Review of Systems  Constitutional: Negative.   HENT: Negative.   Eyes: Negative.   Respiratory: Negative.   Cardiovascular: Negative.   Gastrointestinal: Negative.   Endocrine: Negative.   Genitourinary: Negative.   Musculoskeletal: Negative.   Skin: Negative.   Allergic/Immunologic: Negative.   Neurological: Negative.   Hematological: Negative.   Psychiatric/Behavioral: Negative.    Past Medical History  Diagnosis Date  . Allergy   . Asthma   . Anemia   . Fibrocystic breast     History   Social History  . Marital Status: Married    Spouse Name: N/A    Number of Children: N/A  . Years of Education: N/A   Occupational History  . Not on file.   Social History Main Topics  . Smoking status: Never Smoker   . Smokeless tobacco: Not on file  . Alcohol Use: No  . Drug Use: No  . Sexually Active: Not on file   Other Topics Concern  . Not on file   Social History Narrative  . No narrative on file    Past Surgical History  Procedure Laterality Date  . Wrist surgery      No family history on file.  Not on File  Current Outpatient Prescriptions on File Prior to Visit  Medication Sig Dispense Refill  . albuterol (VENTOLIN HFA) 108 (90 BASE) MCG/ACT inhaler Inhale 1-2 puffs into the lungs. Every 4-6 hours prn.       . ferrous fumarate (HEMOCYTE - 106 MG FE) 325 (106 FE) MG TABS Take 1 tablet by mouth 2 (two) times daily. (does not  take regularly)       . Multiple Vitamins-Minerals (WOMENS MULTIVITAMIN PLUS PO) Take 1 tablet by mouth daily. Includes vit D3 2000 units and omega 3's       . lisdexamfetamine (VYVANSE) 70 MG capsule Take 70 mg by mouth daily.        . sertraline (ZOLOFT) 100 MG tablet Take 100 mg by mouth daily.        Marland Kitchen triamcinolone (KENALOG) 0.1 % cream Apply 1 application topically 2 (two) times daily. To affected area.        No current facility-administered medications on file prior to visit.    BP 130/90  Pulse 76  Ht 5' 4.5" (1.638 m)  Wt 226 lb (102.513 kg)  BMI 38.21 kg/m2  SpO2 98%chart     Objective:   Physical Exam  Constitutional: She is oriented to person, place, and time. She appears well-developed and well-nourished.  HENT:  Right Ear: External ear normal.  Left Ear: External ear normal.  Nose: Nose normal.  Mouth/Throat: Oropharynx is clear and moist.  Eyes: Conjunctivae are normal. Pupils are equal, round, and reactive to light.  Neck: Normal range of motion. Neck supple. No thyromegaly present.  Cardiovascular: Normal rate, regular rhythm and normal heart sounds.   Pulmonary/Chest: Effort normal and breath sounds normal.  Abdominal: Soft. Bowel sounds are normal. She exhibits no  distension. There is no tenderness. There is no rebound and no guarding.  Genitourinary: Vagina normal and uterus normal. No vaginal discharge found.  Musculoskeletal: Normal range of motion.  Neurological: She is alert and oriented to person, place, and time. She has normal reflexes. No cranial nerve deficit. Coordination normal.  Skin: Skin is warm and dry.  Psychiatric: She has a normal mood and affect.          Assessment & Plan:  Assessment:  1. CPX 2. Screening for Malignancy of the Cervix 3. Obesity  Plan: Encouraged cardio exercise daily. Tetanus administerd. Pap smear sent. Start Adipex 37.5mg  once daily. Recheck in 4 weeks.

## 2013-03-25 NOTE — Patient Instructions (Addendum)
Exercise to Stay Healthy Exercise helps you become and stay healthy. EXERCISE IDEAS AND TIPS Choose exercises that:  You enjoy.  Fit into your day. You do not need to exercise really hard to be healthy. You can do exercises at a slow or medium level and stay healthy. You can:  Stretch before and after working out.  Try yoga, Pilates, or tai chi.  Lift weights.  Walk fast, swim, jog, run, climb stairs, bicycle, dance, or rollerskate.  Take aerobic classes. Exercises that burn about 150 calories:  Running 1  miles in 15 minutes.  Playing volleyball for 45 to 60 minutes.  Washing and waxing a car for 45 to 60 minutes.  Playing touch football for 45 minutes.  Walking 1  miles in 35 minutes.  Pushing a stroller 1  miles in 30 minutes.  Playing basketball for 30 minutes.  Raking leaves for 30 minutes.  Bicycling 5 miles in 30 minutes.  Walking 2 miles in 30 minutes.  Dancing for 30 minutes.  Shoveling snow for 15 minutes.  Swimming laps for 20 minutes.  Walking up stairs for 15 minutes.  Bicycling 4 miles in 15 minutes.  Gardening for 30 to 45 minutes.  Jumping rope for 15 minutes.  Washing windows or floors for 45 to 60 minutes. Document Released: 01/04/2011 Document Revised: 02/24/2012 Document Reviewed: 01/04/2011 Exit[employer] Patient Information 2013 [employer].  

## 2013-04-23 ENCOUNTER — Encounter: Payer: Self-pay | Admitting: Family

## 2013-04-23 ENCOUNTER — Ambulatory Visit (INDEPENDENT_AMBULATORY_CARE_PROVIDER_SITE_OTHER): Payer: 59 | Admitting: Family

## 2013-04-23 VITALS — BP 128/80 | HR 76 | Wt 212.5 lb

## 2013-04-23 DIAGNOSIS — E669 Obesity, unspecified: Secondary | ICD-10-CM

## 2013-04-23 DIAGNOSIS — I10 Essential (primary) hypertension: Secondary | ICD-10-CM

## 2013-04-23 DIAGNOSIS — F329 Major depressive disorder, single episode, unspecified: Secondary | ICD-10-CM

## 2013-04-23 MED ORDER — PHENTERMINE HCL 37.5 MG PO CAPS: 37.5000 mg | ORAL_CAPSULE | ORAL | Status: DC

## 2013-04-23 NOTE — Progress Notes (Signed)
Subjective:    Patient ID: Lindsey Davis, female    DOB: 01-24-1961, 52 y.o.   MRN: [id number]  HPI 52 year old white female, nonsmoker, patient of Dr. Cato Mulligan is in today for recheck on obesity. At her last office visit we started phentermine 37.5 mg once daily. She reports doing well. Does report dry mouth as a side effect that helps her to increase her intake of water. She's not exercising consistently. However, has made some dietary changes. Her weight is down 13-1/2 pounds from last month. She also has a history of depression and hypertension are both stable. No concerns.   Review of Systems  Constitutional: Negative.   Respiratory: Negative.   Cardiovascular: Negative.   Neurological: Negative.   Hematological: Negative.   Psychiatric/Behavioral: Negative.    Past Medical History  Diagnosis Date  . Allergy   . Asthma   . Anemia   . Fibrocystic breast     History   Social History  . Marital Status: Married    Spouse Name: N/A    Number of Children: N/A  . Years of Education: N/A   Occupational History  . Not on file.   Social History Main Topics  . Smoking status: Never Smoker   . Smokeless tobacco: Not on file  . Alcohol Use: No  . Drug Use: No  . Sexually Active: Not on file   Other Topics Concern  . Not on file   Social History Narrative  . No narrative on file    Past Surgical History  Procedure Laterality Date  . Wrist surgery      No family history on file.  Not on File  Current Outpatient Prescriptions on File Prior to Visit  Medication Sig Dispense Refill  . albuterol (VENTOLIN HFA) 108 (90 BASE) MCG/ACT inhaler Inhale 1-2 puffs into the lungs. Every 4-6 hours prn.       . amphetamine-dextroamphetamine (ADDERALL) 30 MG tablet Take 30 mg by mouth daily.      . Biotin 5000 MCG CAPS Take by mouth.      . cyanocobalamin 1000 MCG tablet Take 100 mcg by mouth daily.      Marland Kitchen desvenlafaxine (PRISTIQ) 100 MG 24 hr tablet Take 100 mg by mouth daily.       . ferrous fumarate (HEMOCYTE - 106 MG FE) 325 (106 FE) MG TABS Take 1 tablet by mouth 2 (two) times daily. (does not take regularly)       . fexofenadine (ALLEGRA) 180 MG tablet Take 180 mg by mouth daily.      . fluticasone (FLONASE) 50 MCG/ACT nasal spray Place 2 sprays into the nose daily.  16 g  6  . lisdexamfetamine (VYVANSE) 70 MG capsule Take 70 mg by mouth daily.        . Multiple Vitamins-Minerals (WOMENS MULTIVITAMIN PLUS PO) Take 1 tablet by mouth daily. Includes vit D3 2000 units and omega 3's       . sertraline (ZOLOFT) 100 MG tablet Take 100 mg by mouth daily.        Marland Kitchen triamcinolone (KENALOG) 0.1 % cream Apply 1 application topically 2 (two) times daily. To affected area.        No current facility-administered medications on file prior to visit.    BP 128/80  Pulse 76  Wt 212 lb 8 oz (96.389 kg)  BMI 35.93 kg/m2  SpO2 98%chart    Objective:   Physical Exam  Constitutional: She is oriented to person, place, and time. She  appears well-developed and well-nourished.  Neck: Normal range of motion. Neck supple.  Cardiovascular: Normal rate, regular rhythm and normal heart sounds.   Pulmonary/Chest: Effort normal and breath sounds normal.  Abdominal: Soft. Bowel sounds are normal.  Neurological: She is alert and oriented to person, place, and time.  Skin: Skin is warm and dry.  Psychiatric: She has a normal mood and affect.          Assessment & Plan:  Assessment:  1. Obesity-uncontrolled 2. Hypertension 3. Depression  Plan: Continue phentermine 37.5 mg once daily. Strongly encouraged her to incorporate exercise into her regimen. 30 minutes of brisk walking 3-4 days a week. Continue dietary changes. Patient call the office with any questions or concerns. Recheck in 4 weeks sooner as needed.

## 2013-04-23 NOTE — Patient Instructions (Addendum)

## 2013-05-21 ENCOUNTER — Ambulatory Visit: Payer: 59 | Admitting: Family

## 2013-08-19 ENCOUNTER — Other Ambulatory Visit: Payer: Self-pay

## 2013-08-19 DIAGNOSIS — Z1231 Encounter for screening mammogram for malignant neoplasm of breast: Secondary | ICD-10-CM

## 2013-09-24 ENCOUNTER — Ambulatory Visit
Admission: RE | Admit: 2013-09-24 | Discharge: 2013-09-24 | Disposition: A | Discharge status: Home / Self Care | Payer: 59 | Source: Ambulatory Visit

## 2013-09-24 DIAGNOSIS — Z1231 Encounter for screening mammogram for malignant neoplasm of breast: Secondary | ICD-10-CM

## 2013-10-21 ENCOUNTER — Other Ambulatory Visit: Payer: Self-pay

## 2014-02-24 ENCOUNTER — Telehealth (HOSPITAL_COMMUNITY): Payer: Self-pay | Admitting: *Deleted

## 2014-03-08 NOTE — Telephone Encounter (Signed)
Opened in error

## 2014-10-05 ENCOUNTER — Ambulatory Visit: Payer: 59 | Admitting: Family Medicine

## 2014-10-07 ENCOUNTER — Encounter: Payer: 59 | Attending: Family Medicine | Admitting: *Deleted

## 2014-10-07 ENCOUNTER — Encounter: Payer: Self-pay | Admitting: *Deleted

## 2014-10-07 DIAGNOSIS — Z713 Dietary counseling and surveillance: Secondary | ICD-10-CM | POA: Insufficient documentation

## 2014-10-07 DIAGNOSIS — E669 Obesity, unspecified: Secondary | ICD-10-CM | POA: Diagnosis not present

## 2014-10-07 DIAGNOSIS — Z6839 Body mass index (BMI) 39.0-39.9, adult: Secondary | ICD-10-CM | POA: Insufficient documentation

## 2014-10-07 NOTE — Progress Notes (Signed)
Medical Nutrition Therapy:  Appt start time: 0915 end time:  1015.  Assessment:  Primary concern today: obesity. 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 She does have a history of successful weight loss on Weight Watchers and using diet trackers. She states that she needs a plan and guidelines to follow for healthy meals, snacks, and exercise.   MEDICATIONS: See list   DIETARY INTAKE:   Usual eating pattern includes 3 meals and 2 snacks per day.  24-hr recall:  B ( AM): Meal replacement shake  Snk ( AM): None  L ( PM): Meal replacement shake OR salad (almonds, raisins, ranch dressing) Snk ( PM): Cheese and crackers D ( PM): Chicken, vegetables, potatoes Snk ( PM): Banana bread, fruit, sweets Beverages: Water, black coffee  Usual physical activity: walking 2 days weekly  Estimated energy needs: 1500 calories 188 g carbohydrates 94 g protein 42 g fat  Progress Towards Goal(s):  In progress.   Nutritional Diagnosis:  [place]-3.3 Overweight/obesity As related to history of excessive energy intake.  As evidenced by BMI >30.    Intervention:  Nutrition counseling. We discussed strategies for weight loss, including balancing nutrients (carbs, protein, fat), portion control, healthy snacks, and exercise.   Goals:  1. 1 pound weight loss per week.  2. Use custom diet tracking handout provided to monitor meal patterns. 3. Limit snacks to a maximum of 300 calories per day, choosing healthy snacks such as fruit, yogurt, string cheese more frequently.  4. Exercise at least 30 minutes 5 days a week (i.e. Walking around inside or outside of hospital).   Handouts given during visit include:  Weight loss tips  Meal plan card  Monitoring/Evaluation:  Dietary intake, exercise, and body weight in 1 month(s).

## 2014-10-21 ENCOUNTER — Ambulatory Visit (INDEPENDENT_AMBULATORY_CARE_PROVIDER_SITE_OTHER): Payer: 59 | Admitting: Family Medicine

## 2014-10-21 ENCOUNTER — Encounter: Payer: Self-pay | Admitting: Family Medicine

## 2014-10-21 VITALS — BP 122/80 | Temp 98.0°F | Ht 65.0 in | Wt 236.0 lb

## 2014-10-21 DIAGNOSIS — Z1239 Encounter for other screening for malignant neoplasm of breast: Secondary | ICD-10-CM

## 2014-10-21 DIAGNOSIS — E669 Obesity, unspecified: Secondary | ICD-10-CM

## 2014-10-21 DIAGNOSIS — IMO0001 Reserved for inherently not codable concepts without codable children: Secondary | ICD-10-CM

## 2014-10-21 DIAGNOSIS — Z0000 Encounter for general adult medical examination without abnormal findings: Secondary | ICD-10-CM

## 2014-10-21 DIAGNOSIS — N6019 Diffuse cystic mastopathy of unspecified breast: Secondary | ICD-10-CM

## 2014-10-21 NOTE — Progress Notes (Signed)
Lindsey ConchStephen Tyffani Foglesong, MD Phone: [phone]  Subjective:  Patient presents today to establish care with me as their new primary care provider and for annual physical. Patient was formerly a patient of Dr. Cato MulliganSwords. Chief complaint-noted.   Annual physical Up to date on pap Post menopausal without gyn concerns Mother and sister with history of breast cancer. Due for mammogram. Thinks feels something in her left axilla. Schedule a physical.   Obesity Saw nutrition 10/07/14. Tracking intake at this point.  Goal to leave work by 5:30 but ends up 7 at times.  Had been doing boot camps 5:30, ran 5k in 2013.  Thinking about group exercise classes. Plan to do it when leaves work. Goal 2x a week at least 30 minutes.  Discussed statin to avoid weight loss medication. ROS-full ROS completed and negative except for possibly feeling abnormality in left axilla.   The following were reviewed and entered/updated in epic: Past Medical History  Diagnosis Date  . Allergy   . Asthma   . Anemia   . Fibrocystic breast   . Iron deficiency anemia 09/20/2010    Resolved post menopause.     Patient Active Problem List   Diagnosis Date Noted  . Obesity 10/21/2014    Priority: Medium  . Depression 03/28/2008    Priority: Medium  . Attention deficit disorder 03/28/2008    Priority: Medium  . Asthma 02/09/2008    Priority: Medium  . Allergic rhinitis 02/09/2008    Priority: Low   Past Surgical History  Procedure Laterality Date  . Wrist surgery      Family History  Problem Relation Age of Onset  . Breast cancer Mother     1970  . Breast cancer Sister     early 4450s  . Heart attack Father     5673  . Urinary tract infection Mother     chronic    Medications- reviewed and updated Current Outpatient Prescriptions  Medication Sig Dispense Refill  . amphetamine-dextroamphetamine (ADDERALL) 30 MG tablet Take 30 mg by mouth daily.    Marland Kitchen. desvenlafaxine (PRISTIQ) 100 MG 24 hr tablet Take 100 mg by mouth  daily.    . Multiple Vitamins-Minerals (WOMENS MULTIVITAMIN PLUS PO) Take 1 tablet by mouth daily. Includes vit D3 2000 units and omega 3's     . albuterol (VENTOLIN HFA) 108 (90 BASE) MCG/ACT inhaler Inhale 1-2 puffs into the lungs. Every 4-6 hours prn.      No current facility-administered medications for this visit.    Allergies-reviewed and updated No Known Allergies  History   Social History  . Marital Status: Married    Spouse Name: N/A    Number of Children: N/A  . Years of Education: N/A   Social History Main Topics  . Smoking status: Former Smoker -- 1.00 packs/day for 10 years    Types: Cigarettes    Quit date: 12/16/1998  . Smokeless tobacco: None  . Alcohol Use: No  . Drug Use: No  . Sexual Activity: None   Other Topics Concern  . None   Social History Narrative   Married 1990. 3 kids (1 set of twins 16 in [id number], oldest 5621).       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 Oropharynx normal. Good dentition.  Breast: dense tissue in axilla noted and consistent with history of fibrocystic breast disesae Neck: no thyromegaly CV: RRR no murmurs rubs or gallops Lungs: CTAB no crackles, wheeze, rhonchi Abdomen: soft/nontender/nondistended/normal bowel sounds. Ext: no edema Skin: warm, dry, no rash (did not complete full skin exam) Neuro: grossly normal, moves all extremities, PERRLA   Assessment/Plan:  53 y.o. female presenting for annual physical.  Health Maintenance counseling: 1. Anticipatory guidance: Patient counseled regarding regular dental exams, wearing seatbelts.  2. Risk factor reduction:   Advised patient of need for regular exercise and diet rich and fruits and vegetables to reduce risk of heart attack and stroke.  3. Immunizations/screenings/ancillary studies - up to date 4. Plan for labs at next annual physical. Patient declined today  Obesity Extensive counseling provided. We developed a plan for exercise through motivational interviewing. Patient to follow-up in 3 months.  Focus on healthy lifestyle habits with weight as secondary endpoint.   Due to fibrocystic breast Orders Placed This Encounter  Procedures  . MM Digital Diagnostic Bilat    Standing Status: Future     Number of Occurrences:      Standing Expiration Date: 12/22/2015    Order Specific Question:  Reason for Exam (SYMPTOM  OR DIAGNOSIS REQUIRED)    Answer:  fibrocystic breast disease-patient may palpate abnormality left axilla, I cannot detect on exam    Order Specific Question:  Is the patient pregnant?    Answer:  No    Order Specific Question:  Preferred imaging location?    Answer[facility]  [facility]

## 2014-10-21 NOTE — Assessment & Plan Note (Signed)
Extensive counseling provided. We developed a plan for exercise through motivational interviewing. Patient to follow-up in 3 months.

## 2014-10-21 NOTE — Patient Instructions (Addendum)
You set a goal of exercising a minimum of 2x a week for at least 30 minutes *7 minute workout then a 20-25 minute walk.   We will send you for diagnostic mammogram given fibrocystic breasts  53 y.o. female presenting for annual physical.  Health Maintenance counseling: 1. Anticipatory guidance: Patient counseled regarding regular dental exams, wearing seatbelts.  2. Risk factor reduction:  Advised patient of need for regular exercise and diet rich and fruits and vegetables to reduce risk of heart attack and stroke.  3. Immunizations/screenings/ancillary studies - up to date

## 2014-10-24 ENCOUNTER — Other Ambulatory Visit: Payer: Self-pay | Admitting: Family Medicine

## 2014-10-24 DIAGNOSIS — Z1239 Encounter for other screening for malignant neoplasm of breast: Secondary | ICD-10-CM

## 2014-10-24 DIAGNOSIS — N6019 Diffuse cystic mastopathy of unspecified breast: Secondary | ICD-10-CM

## 2014-11-08 ENCOUNTER — Ambulatory Visit
Admission: RE | Admit: 2014-11-08 | Discharge: 2014-11-08 | Disposition: A | Discharge status: Home / Self Care | Payer: 59 | Source: Ambulatory Visit | Attending: Family Medicine | Admitting: Family Medicine

## 2014-11-08 DIAGNOSIS — N6019 Diffuse cystic mastopathy of unspecified breast: Secondary | ICD-10-CM

## 2014-11-08 DIAGNOSIS — Z1239 Encounter for other screening for malignant neoplasm of breast: Secondary | ICD-10-CM

## 2014-11-18 ENCOUNTER — Encounter: Payer: 59 | Attending: Family Medicine | Admitting: Dietician

## 2014-11-18 DIAGNOSIS — Z713 Dietary counseling and surveillance: Secondary | ICD-10-CM | POA: Diagnosis not present

## 2014-11-18 DIAGNOSIS — E669 Obesity, unspecified: Secondary | ICD-10-CM | POA: Insufficient documentation

## 2014-11-18 DIAGNOSIS — Z6839 Body mass index (BMI) 39.0-39.9, adult: Secondary | ICD-10-CM | POA: Insufficient documentation

## 2014-11-19 NOTE — Progress Notes (Signed)
Ms. Allmon is here for follow-up for weight management.  She previously met with Elyse for her initial visit and has attended the Weigh to Wellness class series offered by [facility] years ago.  Today she weighs 232.6 lbs and her goal is to get below 200 lbs.  She had successful wt loss in the past with Weight Watchers and being more active (running 5k, etc).  She is monitoring her food intake and using her FitBit to meet her goal of 10,000 steps each day of daily activity.  She is considering starting some structured exercise and wants some refreshers on how many calories she should be aiming for each day.  Dietary recall:  B - protein shake (150 cal) or oatmeal with blueberries S - not usually L - sugar free pudding or salad from the cafeteria with spinach, onions, bell peppers, raisins, eggs, seasame seed, red pepper seasoning, cheese (sometimes small amounts sometimes more), lt. Balsamic vin., sometimes chicken or ham or tuna, and sometimes crackers S - string cheese or pepper jack cheese and crackers D - varies: ham and cheese biscuit, chicken pie, sub sandwich, oatmeal, spaghetti S - salt caramel chocolates Bev - water  Intervention: Nutrition counseling and goal setting. Patient is motivated to change.  Suggested tracking food with smartphone app for real time calorie counting.  Patient has used this tool in the past and liked the idea.  Created account with [employer] food and activity tracker.  Goal is for patient to consume 1500-1700 calories per day. Patient also open to increasing exercise goals.  Her plan is to exercise 2 times a week, once at a group class offered by [facility] and then walk for 45 min with her dog at home or on the treadmill at the gym at work if there is bad weather.  She states she has an extra pair of shoes at work to walk and will keep an extra set of workout clothes at work too.  Monitor/Evalaute: Dietary recall, exercise, and weight at follow-up in 2 months.

## 2014-12-15 ENCOUNTER — Ambulatory Visit: Payer: 59 | Admitting: Family Medicine

## 2015-01-09 ENCOUNTER — Telehealth: Payer: Self-pay | Admitting: Family

## 2015-01-09 DIAGNOSIS — IMO0001 Reserved for inherently not codable concepts without codable children: Secondary | ICD-10-CM

## 2015-01-09 NOTE — Progress Notes (Signed)
We are sorry that you are not feeling well.  Here is how we plan to help!  Based on what you shared with me it looks like you will need a face to face visit for complicated symptoms that could represent a serious infection.  If you are having a true medical emergency please call 911.  If you need an urgent face to face visit, [place] has four urgent care centers for your convenience.    [facility]  [phone] Get Driving Directions Find a Provider at this Location  [address] [place], [place] [address]   8 am to 8 pm Monday-Friday   9 am to 7 pm Saturday-Sunday    [facility]  [phone] Get Driving Directions Find a Provider at this Location  [address], [address] , [place] [address]   8 am to 8 pm Monday-Friday   9 am to 6 pm Saturday   11 am to 6 pm Sunday     [facility] [place]  [phone] Get Driving Directions  [address] [place], [place] [address]   8 am to 8 pm Monday-Friday   9 am to 4 pm Saturday-Sunday     [facility] (a walk in primary care provider)  [phone]  Get Driving Directions Find a Provider at this Location  [address] [place], [place] [address]   8 am to 8:30 pm Monday-Thursday   8 am to 6 pm Friday   8 am to 4 pm Saturday-Sunday  Your e-visit answers were reviewed by a board certified advanced clinical practitioner to complete your personal care plan.  Depending on the condition, your plan could have included both over the counter or prescription medications.  You will get an e-mail in the next two days asking about your experience.  I hope that your e-visit has been valuable and will speed your recovery . Thank you for choosing an e-visit.       

## 2015-02-25 ENCOUNTER — Ambulatory Visit (INDEPENDENT_AMBULATORY_CARE_PROVIDER_SITE_OTHER): Payer: 59 | Admitting: Family Medicine

## 2015-02-25 VITALS — BP 120/90 | HR 84 | Temp 97.9°F | Resp 12 | Ht 64.75 in | Wt 224.2 lb

## 2015-02-25 DIAGNOSIS — J4521 Mild intermittent asthma with (acute) exacerbation: Secondary | ICD-10-CM | POA: Diagnosis not present

## 2015-02-25 DIAGNOSIS — J069 Acute upper respiratory infection, unspecified: Secondary | ICD-10-CM | POA: Diagnosis not present

## 2015-02-25 DIAGNOSIS — R6889 Other general symptoms and signs: Secondary | ICD-10-CM | POA: Diagnosis not present

## 2015-02-25 DIAGNOSIS — J309 Allergic rhinitis, unspecified: Secondary | ICD-10-CM

## 2015-02-25 LAB — POCT INFLUENZA A/B
INFLUENZA B, POC: NEGATIVE
Influenza A, POC: NEGATIVE

## 2015-02-25 MED ORDER — FLUTICASONE PROPIONATE 50 MCG/ACT NA SUSP: 2.0000 | Freq: Every day | NASAL | Status: DC

## 2015-02-25 MED ORDER — GUAIFENESIN-CODEINE 100-10 MG/5ML PO SOLN: 5.0000 mL | Freq: Four times a day (QID) | ORAL | Status: DC | PRN

## 2015-02-25 MED ORDER — ALBUTEROL SULFATE HFA 108 (90 BASE) MCG/ACT IN AERS
2.0000 | INHALATION_SPRAY | Freq: Four times a day (QID) | RESPIRATORY_TRACT | Status: DC | PRN
Start: 2015-02-25 — End: 2017-03-18

## 2015-02-25 MED ORDER — PREDNISONE 20 MG PO TABS: ORAL_TABLET | ORAL | Status: DC

## 2015-02-25 MED ORDER — AMOXICILLIN-POT CLAVULANATE 875-125 MG PO TABS: 1.0000 | ORAL_TABLET | Freq: Two times a day (BID) | ORAL | Status: DC

## 2015-02-25 NOTE — Progress Notes (Signed)
Subjective:    Patient ID: Lindsey Davis, female    DOB: 10-10-1961, 54 y.o.   MRN: [id number]  02/25/2015  Cough   HPI  HPI Comments: KENDA KLOEHN is a 54 y.o. female who presents to the [facility] complaining of dry cough (worst at night), onset Wednesday night/four days ago. She also complains of constant sore throat, drainage, generalized body aches, and ear pain. She notes she has tried using Ibuprofen, Alka-Seltzer, Mucinex cough syrup, and gargling salt water to address symptoms without relief. Pt states she is not sure whether she has been wheezing but adds that she is currently out of her albuterol so she has not been able to use it. No complaints of fever or sinus pressure. 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 History of asthma and allergic rhinitis. Pt reports getting sick same time every year.  No maintenance allergy medications.   Review of Systems  Constitutional: Positive for fatigue. Negative for fever, chills and diaphoresis.  HENT: Positive for congestion, ear pain, postnasal drip, rhinorrhea, sore throat and voice change. Negative for ear discharge, sinus pressure and trouble swallowing.   Respiratory: Positive for cough. Negative for shortness of breath and wheezing.   Gastrointestinal: Negative for nausea, vomiting, abdominal pain and diarrhea.  Musculoskeletal: Positive for myalgias.  Skin: Negative for rash.  Allergic/Immunologic: Positive for environmental allergies.  Neurological: Negative for headaches.    Past Medical History  Diagnosis Date  . Allergy   . Asthma   . Anemia   . Fibrocystic breast   . Iron deficiency anemia 09/20/2010    Resolved post menopause.     Past Surgical History  Procedure Laterality Date  . Wrist surgery     No Known Allergies Current Outpatient Prescriptions  Medication Sig Dispense Refill  .  amphetamine-dextroamphetamine (ADDERALL) 30 MG tablet Take 30 mg by mouth daily.    Marland Kitchen desvenlafaxine (PRISTIQ) 100 MG 24 hr tablet Take 100 mg by mouth daily.    . Levothyroxine Sodium (SYNTHROID PO) Take 1 tablet by mouth daily.    Marland Kitchen albuterol (PROVENTIL HFA;VENTOLIN HFA) 108 (90 BASE) MCG/ACT inhaler Inhale 2 puffs into the lungs every 6 (six) hours as needed for wheezing or shortness of breath (cough, shortness of breath or wheezing.). 1 Inhaler 1  . amoxicillin-clavulanate (AUGMENTIN) 875-125 MG per tablet Take 1 tablet by mouth 2 (two) times daily. 20 tablet 0  . fluticasone (FLONASE) 50 MCG/ACT nasal spray Place 2 sprays into both nostrils daily. 16 g 11  . guaiFENesin-codeine 100-10 MG/5ML syrup Take 5 mLs by mouth every 6 (six) hours as needed for cough. 180 mL 0  . predniSONE (DELTASONE) 20 MG tablet Two tablets daily x 5 days then one tablet daily x 5 days 15 tablet 0   No current facility-administered medications for this visit.       Objective:    BP 120/90 mmHg  Pulse 84  Temp(Src) 97.9 F (36.6 C) (Oral)  Resp 12  Ht 5' 4.75" (1.645 m)  Wt 224 lb 4 oz (101.719 kg)  BMI 37.59 kg/m2  SpO2 98%  LMP 03/31/2011 Physical Exam  Constitutional: She is oriented to person, place, and time. She appears well-developed and well-nourished. No distress.  HENT:  Head: Normocephalic and atraumatic.  Right Ear: Tympanic membrane, external ear and ear canal normal.  Left Ear: Tympanic membrane, external  ear and ear canal normal.  Nose: Mucosal edema and rhinorrhea present. Right sinus exhibits no maxillary sinus tenderness and no frontal sinus tenderness. Left sinus exhibits no maxillary sinus tenderness and no frontal sinus tenderness.  Mouth/Throat: Oropharynx is clear and moist. No oropharyngeal exudate.  Eyes: Conjunctivae and EOM are normal.  Neck: Neck supple. No tracheal deviation present.  Cardiovascular: Normal rate, regular rhythm and normal heart sounds.   Pulmonary/Chest:  Effort normal and breath sounds normal. No respiratory distress. She has no wheezes. She has no rales.  Musculoskeletal: Normal range of motion.  Lymphadenopathy:    She has no cervical adenopathy.  Neurological: She is alert and oriented to person, place, and time.  Skin: Skin is warm and dry. No rash noted. She is not diaphoretic.  Psychiatric: She has a normal mood and affect. Her behavior is normal.  Nursing note and vitals reviewed.  Results for orders placed or performed in visit on 02/25/15  POCT Influenza A/B  Result Value Ref Range   Influenza A, POC Negative    Influenza B, POC Negative        Assessment & Plan:   1. Flu-like symptoms   2. Acute upper respiratory infection   3. Asthma with acute exacerbation, mild intermittent   4. Allergic rhinitis, unspecified allergic rhinitis type     1. URI:  New.  Supportive care with rest, fluids, Ibuprofen.  Continue Mucinex cough syrup; start Flonase and Claritin for allergic component. Rx for Robitussin with codeine.  Due to asthma, treat with Augmentin. 2.  Asthma exacerbation: Mild.  Rx for Albuterol provided and Prednisone taper. 3.  Allergic Rhinitis: chronic; rx for Flonase provided.  Start Claritin 10mg  daily.   Meds ordered this encounter  Medications  . Levothyroxine Sodium (SYNTHROID PO)    Sig: Take 1 tablet by mouth daily.  Marland Kitchen. albuterol (PROVENTIL HFA;VENTOLIN HFA) 108 (90 BASE) MCG/ACT inhaler    Sig: Inhale 2 puffs into the lungs every 6 (six) hours as needed for wheezing or shortness of breath (cough, shortness of breath or wheezing.).    Dispense:  1 Inhaler    Refill:  1  . DISCONTD: predniSONE (DELTASONE) 20 MG tablet    Sig: Two tablets daily x 5 days then one tablet daily x 5 days    Dispense:  15 tablet    Refill:  0  . DISCONTD: amoxicillin-clavulanate (AUGMENTIN) 875-125 MG per tablet    Sig: Take 1 tablet by mouth 2 (two) times daily.    Dispense:  20 tablet    Refill:  0  . guaiFENesin-codeine  100-10 MG/5ML syrup    Sig: Take 5 mLs by mouth every 6 (six) hours as needed for cough.    Dispense:  180 mL    Refill:  0  . DISCONTD: fluticasone (FLONASE) 50 MCG/ACT nasal spray    Sig: Place 2 sprays into both nostrils daily.    Dispense:  16 g    Refill:  11  . amoxicillin-clavulanate (AUGMENTIN) 875-125 MG per tablet    Sig: Take 1 tablet by mouth 2 (two) times daily.    Dispense:  20 tablet    Refill:  0  . fluticasone (FLONASE) 50 MCG/ACT nasal spray    Sig: Place 2 sprays into both nostrils daily.    Dispense:  16 g    Refill:  11  . predniSONE (DELTASONE) 20 MG tablet    Sig: Two tablets daily x 5 days then one tablet daily x 5  days    Dispense:  15 tablet    Refill:  0    No Follow-up on file.   I personally performed the services described in this documentation, which was scribed in my presence. The recorded information has been reviewed and considered.   Zoiee Wimmer Paulita Fujita, M.D. [facility] [address] [place], [place]  [address] [phone] phone [phone] fax

## 2015-02-25 NOTE — Patient Instructions (Signed)
Upper Respiratory Infection, Adult An upper respiratory infection (URI) is also sometimes known as the common cold. The upper respiratory tract includes the nose, sinuses, throat, trachea, and bronchi. Bronchi are the airways leading to the lungs. Most people improve within 1 week, but symptoms can last up to 2 weeks. A residual cough may last even longer.  CAUSES Many different viruses can infect the tissues lining the upper respiratory tract. The tissues become irritated and inflamed and often become very moist. Mucus production is also common. A cold is contagious. You can easily spread the virus to others by oral contact. This includes kissing, sharing a glass, coughing, or sneezing. Touching your mouth or nose and then touching a surface, which is then touched by another person, can also spread the virus. SYMPTOMS  Symptoms typically develop 1 to 3 days after you come in contact with a cold virus. Symptoms vary from person to person. They may include:  Runny nose.  Sneezing.  Nasal congestion.  Sinus irritation.  Sore throat.  Loss of voice (laryngitis).  Cough.  Fatigue.  Muscle aches.  Loss of appetite.  Headache.  Low-grade fever. DIAGNOSIS  You might diagnose your own cold based on familiar symptoms, since most people get a cold 2 to 3 times a year. Your caregiver can confirm this based on your exam. Most importantly, your caregiver can check that your symptoms are not due to another disease such as strep throat, sinusitis, pneumonia, asthma, or epiglottitis. Blood tests, throat tests, and X-rays are not necessary to diagnose a common cold, but they may sometimes be helpful in excluding other more serious diseases. Your caregiver will decide if any further tests are required. RISKS AND COMPLICATIONS  You may be at risk for a more severe case of the common cold if you smoke cigarettes, have chronic heart disease (such as heart failure) or lung disease (such as asthma), or if  you have a weakened immune system. The very young and very old are also at risk for more serious infections. Bacterial sinusitis, middle ear infections, and bacterial pneumonia can complicate the common cold. The common cold can worsen asthma and chronic obstructive pulmonary disease (COPD). Sometimes, these complications can require emergency medical care and may be life-threatening. PREVENTION  The best way to protect against getting a cold is to practice good hygiene. Avoid oral or hand contact with people with cold symptoms. Wash your hands often if contact occurs. There is no clear evidence that vitamin C, vitamin E, echinacea, or exercise reduces the chance of developing a cold. However, it is always recommended to get plenty of rest and practice good nutrition. TREATMENT  Treatment is directed at relieving symptoms. There is no cure. Antibiotics are not effective, because the infection is caused by a virus, not by bacteria. Treatment may include:  Increased fluid intake. Sports drinks offer valuable electrolytes, sugars, and fluids.  Breathing heated mist or steam (vaporizer or shower).  Eating chicken soup or other clear broths, and maintaining good nutrition.  Getting plenty of rest.  Using gargles or lozenges for comfort.  Controlling fevers with ibuprofen or acetaminophen as directed by your caregiver.  Increasing usage of your inhaler if you have asthma. Zinc gel and zinc lozenges, taken in the first 24 hours of the common cold, can shorten the duration and lessen the severity of symptoms. Pain medicines may help with fever, muscle aches, and throat pain. A variety of non-prescription medicines are available to treat congestion and runny nose. Your caregiver   can make recommendations and may suggest nasal or lung inhalers for other symptoms.  HOME CARE INSTRUCTIONS   Only take over-the-counter or prescription medicines for pain, discomfort, or fever as directed by your  caregiver.  Use a warm mist humidifier or inhale steam from a shower to increase air moisture. This may keep secretions moist and make it easier to breathe.  Drink enough water and fluids to keep your urine clear or pale yellow.  Rest as needed.  Return to work when your temperature has returned to normal or as your caregiver advises. You may need to stay home longer to avoid infecting others. You can also use a face mask and careful hand washing to prevent spread of the virus. SEEK MEDICAL CARE IF:   After the first few days, you feel you are getting worse rather than better.  You need your caregiver's advice about medicines to control symptoms.  You develop chills, worsening shortness of breath, or brown or red sputum. These may be signs of pneumonia.  You develop yellow or brown nasal discharge or pain in the face, especially when you bend forward. These may be signs of sinusitis.  You develop a fever, swollen neck glands, pain with swallowing, or white areas in the back of your throat. These may be signs of strep throat. SEEK IMMEDIATE MEDICAL CARE IF:   You have a fever.  You develop severe or persistent headache, ear pain, sinus pain, or chest pain.  You develop wheezing, a prolonged cough, cough up blood, or have a change in your usual mucus (if you have chronic lung disease).  You develop sore muscles or a stiff neck. Document Released: 05/28/2001 Document Revised: 02/24/2012 Document Reviewed: 03/09/2014 Exit[employer] Patient Information 2015 [employer]. This information is not intended to replace advice given to you by your health care provider. Make sure you discuss any questions you have with your health care provider.  

## 2015-06-21 ENCOUNTER — Encounter: Payer: Self-pay | Admitting: Internal Medicine

## 2015-09-16 ENCOUNTER — Telehealth: Payer: 59 | Admitting: Family

## 2015-09-16 DIAGNOSIS — R05 Cough: Secondary | ICD-10-CM | POA: Diagnosis not present

## 2015-09-16 DIAGNOSIS — R059 Cough, unspecified: Secondary | ICD-10-CM

## 2015-09-16 MED ORDER — BENZONATATE 100 MG PO CAPS: 100.0000 mg | ORAL_CAPSULE | Freq: Two times a day (BID) | ORAL | Status: DC | PRN

## 2015-09-16 NOTE — Progress Notes (Signed)
  We are sorry that you are not feeling well.  Here is how we plan to help!  Based on what you have shared with me it looks like you have upper respiratory tract inflammation that has resulted in a significant cough.  Inflammation and infection in the upper respiratory tract is commonly called bronchitis and has four common causes:  Allergies, Viral Infections, Acid Reflux and Bacterial Infections.  Allergies, viruses and acid reflux are treated by controlling symptoms or eliminating the cause. An example might be a cough caused by taking certain blood pressure medications. You stop the cough by changing the medication. Another example might be a cough caused by acid reflux. Controlling the reflux helps control the cough.  Based on your presentation I believe you most likely have A cough due to allergies.  I recommend that you start the an over-the counter-allergy medication such as Claritin 10 mg or Zyrtec 10 mg daily.    In addition you may use A non-prescription cough medication called Robitussin DAC. Take 2 teaspoons every 8 hours or Delsym: take 2 teaspoons every 12 hours. and A prescription cough medication called Tessalon Perles . You may take 1-2 capsules every 8 hours as needed for your cough.    HOME CARE . Only take medications as instructed by your medical team. . Complete the entire course of an antibiotic. . Drink plenty of fluids and get plenty of rest. . Avoid close contacts especially the very young and the elderly . Cover your mouth if you cough or cough into your sleeve. . Always remember to wash your hands . A steam or ultrasonic humidifier can help congestion.    GET HELP RIGHT AWAY IF: . You develop worsening fever. . You become short of breath . You cough up blood. . Your symptoms persist after you have completed your treatment plan MAKE SURE YOU   Understand these instructions.  Will watch your condition.  Will get help right away if you are not doing well or  get worse.  Your e-visit answers were reviewed by a board certified advanced clinical practitioner to complete your personal care plan.  Depending on the condition, your plan could have included both over the counter or prescription medications. If there is a problem please reply  once you have received a response from your provider. Your safety is important to [place].  If you have drug allergies check your prescription carefully.    You can use MyChart to ask questions about today's visit, request a non-urgent call back, or ask for a work or school excuse for 24 hours related to this e-Visit. If it has been greater than 24 hours you will need to follow up with your provider, or enter a new e-Visit to address those concerns. You will get an e-mail in the next two days asking about your experience.  I hope that your e-visit has been valuable and will speed your recovery. Thank you for using e-visits.

## 2015-09-18 ENCOUNTER — Telehealth: Payer: Self-pay | Admitting: *Deleted

## 2015-09-18 NOTE — Telephone Encounter (Signed)
Pt requested a call back from her e-visit on 09/16/15.  She stated she was coughing a lot and thought maybe she had bronchitis.  She said she went to the drug store and got her something for it and slept all day.  She stated she is feeling better and does not need anything.  Nothing further needed at this time.

## 2015-10-16 ENCOUNTER — Other Ambulatory Visit: Payer: Self-pay

## 2015-10-16 DIAGNOSIS — Z1231 Encounter for screening mammogram for malignant neoplasm of breast: Secondary | ICD-10-CM

## 2015-11-20 ENCOUNTER — Ambulatory Visit
Admission: RE | Admit: 2015-11-20 | Discharge: 2015-11-20 | Disposition: A | Discharge status: Home / Self Care | Payer: 59 | Source: Ambulatory Visit

## 2015-11-20 DIAGNOSIS — Z1231 Encounter for screening mammogram for malignant neoplasm of breast: Secondary | ICD-10-CM

## 2015-12-22 MED FILL — AMPHETAMINE SALTS 20 MG TAB: 20 | 90 days supply | Qty: 90 | Fill #0

## 2015-12-22 MED FILL — DEXTROAMP-AMPHET ER 30 MG C: 30 | 90 days supply | Qty: 180 | Fill #0

## 2016-01-12 ENCOUNTER — Other Ambulatory Visit (INDEPENDENT_AMBULATORY_CARE_PROVIDER_SITE_OTHER): Payer: 59

## 2016-01-12 DIAGNOSIS — Z0000 Encounter for general adult medical examination without abnormal findings: Secondary | ICD-10-CM

## 2016-01-12 LAB — POC URINALSYSI DIPSTICK (AUTOMATED)
BILIRUBIN UA: NEGATIVE
Blood, UA: NEGATIVE
Glucose, UA: NEGATIVE
KETONES UA: NEGATIVE
Leukocytes, UA: NEGATIVE
Nitrite, UA: NEGATIVE
PH UA: 8.5
Protein, UA: NEGATIVE
Spec Grav, UA: 1.015
Urobilinogen, UA: 0.2

## 2016-01-12 LAB — CBC WITH DIFFERENTIAL/PLATELET
BASOS ABS: 0 10*3/uL (ref 0.0–0.1)
Basophils Relative: 0.6 % (ref 0.0–3.0)
Eosinophils Absolute: 0 10*3/uL (ref 0.0–0.7)
Eosinophils Relative: 1 % (ref 0.0–5.0)
HCT: 40.4 % (ref 36.0–46.0)
Hemoglobin: 13.4 g/dL (ref 12.0–15.0)
LYMPHS ABS: 1.3 10*3/uL (ref 0.7–4.0)
Lymphocytes Relative: 30.9 % (ref 12.0–46.0)
MCHC: 33.2 g/dL (ref 30.0–36.0)
MCV: 88.1 fl (ref 78.0–100.0)
Monocytes Absolute: 0.2 10*3/uL (ref 0.1–1.0)
Monocytes Relative: 5.5 % (ref 3.0–12.0)
NEUTROS ABS: 2.7 10*3/uL (ref 1.4–7.7)
NEUTROS PCT: 62 % (ref 43.0–77.0)
PLATELETS: 211 10*3/uL (ref 150.0–400.0)
RBC: 4.59 Mil/uL (ref 3.87–5.11)
RDW: 14.1 % (ref 11.5–15.5)
WBC: 4.3 10*3/uL (ref 4.0–10.5)

## 2016-01-12 LAB — LIPID PANEL
CHOL/HDL RATIO: 3
Cholesterol: 195 mg/dL (ref 0–200)
HDL: 57.2 mg/dL (ref >39.00)
LDL CALC: 125 mg/dL — AB (ref 0–99)
NONHDL: 137.55
Triglycerides: 63 mg/dL (ref 0.0–149.0)
VLDL: 12.6 mg/dL (ref 0.0–40.0)

## 2016-01-12 LAB — HEPATIC FUNCTION PANEL
ALK PHOS: 56 U/L (ref 39–117)
ALT: 17 U/L (ref 0–35)
AST: 17 U/L (ref 0–37)
Albumin: 4.3 g/dL (ref 3.5–5.2)
BILIRUBIN DIRECT: 0 mg/dL (ref 0.0–0.3)
BILIRUBIN TOTAL: 0.5 mg/dL (ref 0.2–1.2)
Total Protein: 6.7 g/dL (ref 6.0–8.3)

## 2016-01-12 LAB — BASIC METABOLIC PANEL
BUN: 20 mg/dL (ref 6–23)
CO2: 30 meq/L (ref 19–32)
CREATININE: 0.67 mg/dL (ref 0.40–1.20)
Calcium: 9.3 mg/dL (ref 8.4–10.5)
Chloride: 103 mEq/L (ref 96–112)
GFR: 97.02 mL/min (ref >60.00)
Glucose, Bld: 93 mg/dL (ref 70–99)
Potassium: 4.2 mEq/L (ref 3.5–5.1)
Sodium: 139 mEq/L (ref 135–145)

## 2016-01-12 LAB — TSH: TSH: 2.36 u[IU]/mL (ref 0.35–4.50)

## 2016-01-17 ENCOUNTER — Other Ambulatory Visit (HOSPITAL_COMMUNITY)
Admission: RE | Admit: 2016-01-17 | Discharge: 2016-01-17 | Disposition: A | Discharge status: Home / Self Care | Payer: 59 | Source: Ambulatory Visit | Attending: Family Medicine | Admitting: Family Medicine

## 2016-01-17 ENCOUNTER — Ambulatory Visit (INDEPENDENT_AMBULATORY_CARE_PROVIDER_SITE_OTHER): Payer: 59 | Admitting: Family Medicine

## 2016-01-17 ENCOUNTER — Encounter: Payer: Self-pay | Admitting: Family Medicine

## 2016-01-17 VITALS — BP 122/82 | HR 91 | Temp 98.6°F | Ht 64.0 in | Wt 217.0 lb

## 2016-01-17 DIAGNOSIS — Z0000 Encounter for general adult medical examination without abnormal findings: Secondary | ICD-10-CM | POA: Diagnosis not present

## 2016-01-17 DIAGNOSIS — E039 Hypothyroidism, unspecified: Secondary | ICD-10-CM | POA: Insufficient documentation

## 2016-01-17 DIAGNOSIS — Z01419 Encounter for gynecological examination (general) (routine) without abnormal findings: Secondary | ICD-10-CM | POA: Diagnosis not present

## 2016-01-17 NOTE — Addendum Note (Signed)
Addended by: Lieutenant Diego A on: 01/17/2016 02:37 PM   Modules accepted: Orders

## 2016-01-17 NOTE — Patient Instructions (Addendum)
Ask lab to add HIV and Hepatitis C screen to your next set of labs  We will call you about pap smear results.   Great job on weight loss! You set a goal within a year to be under 200.

## 2016-01-17 NOTE — Progress Notes (Signed)
Tana Conch, MD Phone: [phone]  Subjective:  Patient presents today for their annual physical. Chief complaint-noted.   See problem oriented charting- ROS- full  review of systems was completed and negative except for: attention issues controled with medicine  The following were reviewed and entered/updated in epic: Past Medical History  Diagnosis Date  . Allergy   . Asthma   . Anemia   . Fibrocystic breast   . Iron deficiency anemia 09/20/2010    Resolved post menopause.     Patient Active Problem List   Diagnosis Date Noted  . Obesity 10/21/2014    Priority: Medium  . Depression 03/28/2008    Priority: Medium  . Attention deficit disorder 03/28/2008    Priority: Medium  . Asthma 02/09/2008    Priority: Medium  . Allergic rhinitis 02/09/2008    Priority: Low   Past Surgical History  Procedure Laterality Date  . Wrist surgery      Family History  Problem Relation Age of Onset  . Breast cancer Mother     37  . Urinary tract infection Mother     chronic  . Breast cancer Sister     early 56s  . Heart attack Father     59  . Cancer Paternal Grandmother     Medications- reviewed and updated Current Outpatient Prescriptions  Medication Sig Dispense Refill  . amphetamine-dextroamphetamine (ADDERALL) 20 MG tablet Take 20 mg by mouth as needed.    Marland Kitchen amphetamine-dextroamphetamine (ADDERALL) 30 MG tablet Take 30 mg by mouth daily.    . ARIPiprazole (ABILIFY) 2 MG tablet Take 2 mg by mouth daily.    Marland Kitchen desvenlafaxine (PRISTIQ) 100 MG 24 hr tablet Take 100 mg by mouth daily.    . Levothyroxine Sodium (SYNTHROID PO) Take 1 tablet by mouth daily.    . predniSONE (DELTASONE) 20 MG tablet Two tablets daily x 5 days then one tablet daily x 5 days 15 tablet 0  . VITAMIN D, CHOLECALCIFEROL, PO Take 1.25 mg by mouth.    Marland Kitchen albuterol (PROVENTIL HFA;VENTOLIN HFA) 108 (90 BASE) MCG/ACT inhaler Inhale 2 puffs into the lungs every 6 (six) hours as needed for wheezing or  shortness of breath (cough, shortness of breath or wheezing.). (Patient not taking: Reported on 01/17/2016) 1 Inhaler 1   Allergies-reviewed and updated No Known Allergies  Social History   Social History  . Marital Status: Married    Spouse Name: N/A  . Number of Children: N/A  . Years of Education: N/A   Social History Main Topics  . Smoking status: Former Smoker -- 1.00 packs/day for 10 years    Types: Cigarettes    Quit date: 12/16/1998  . Smokeless tobacco: Never Used  . Alcohol Use: No  . Drug Use: No  . Sexual Activity: Not Asked   Other Topics Concern  . None   Social History Narrative   Married 1990. 3 kids (1 set of twins 16 in 41, oldest 53).       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 Oropharynx normal Neck: no thyromegaly CV: RRR  no murmurs rubs or gallops Lungs: CTAB no crackles, wheeze, rhonchi Abdomen: soft/nontender/nondistended/normal bowel sounds. No rebound or guarding.  Ext: no edema Skin: warm, dry, no rash Neuro: grossly normal, moves all extremities, PERRLA  Breasts: normal appearance, no masses or tenderness- denser than normal breast tissue Pelvic: cervix normal in appearance, external genitalia normal, no adnexal masses or tenderness, no cervical motion tenderness, uterus normal size, shape, and consistency and vagina normal without discharge   Assessment/Plan:  55 y.o. female presenting for annual physical.  Health Maintenance counseling: 1. Anticipatory guidance: Patient counseled regarding regular dental exams, eye exams, wearing seatbelts.  2. Risk factor  reduction:  Advised patient of need for regular exercise and diet rich and fruits and vegetables to reduce risk of heart attack and stroke. Goal to be under 200 within a year. Has picked up exercise a lot has been going 5 days a week.  3. Immunizations/screenings/ancillary studies Health Maintenance Due  Topic Date Due  . Hepatitis C Screening - next labs 1961/11/10  . HIV Screening - next labs 09/26/1975   4. Cervical cancer screening- will update today, last screened 2014 5. Breast cancer screening-  breast exam today  and mammogram 11/2015 6. Colon cancer screening - 10/16/2010 with 10 year follow up  Depression- stable on pristiq and abilify through Dr. Helane Rima ADD- managed by Dr. Evelene Croon Adderall Albuterol pre exercise still working for asthma- not needed at other times Levothyroxine started by psychiatry as well at low dose. Thyroid normal on labs today  Mild hyperlipidemia- 1.7% 10 year risk would not start statin  Return in about 1 year (around 01/16/2017) for physical. Return precautions advised.   Meds ordered this encounter  Medications  . VITAMIN D, CHOLECALCIFEROL, PO    Sig: Take 1.25 mg by mouth.  Marland Kitchen amphetamine-dextroamphetamine (ADDERALL) 20 MG tablet    Sig: Take 20 mg by mouth as needed.  . ARIPiprazole (ABILIFY) 2 MG tablet    Sig: Take 2 mg by mouth daily.

## 2016-01-18 LAB — CYTOLOGY - PAP

## 2016-01-23 MED FILL — SYNTHROID 25 MCG TABLET: 25 | 90 days supply | Qty: 90 | Fill #2

## 2016-01-25 MED FILL — ARIPiprazole 2 MG TABS: 2 | 30 days supply | Qty: 30 | Fill #1

## 2016-01-31 MED FILL — ELFOLATE 15 MG TAB: 15 | 90 days supply | Qty: 90 | Fill #2

## 2016-02-04 MED FILL — STRATTERA 80 MG CAPSULE: 80 | 90 days supply | Qty: 90 | Fill #3

## 2016-02-26 MED FILL — ABILIFY 2 MG TABLET: 2 | 30 days supply | Qty: 30 | Fill #2

## 2016-02-26 MED FILL — PRISTIQ ER 100 MG TABLET: 100 | 30 days supply | Qty: 30 | Fill #1

## 2016-03-20 MED FILL — DESVENLAFAXINE SUC ER 100 M: 100 | 90 days supply | Qty: 90 | Fill #2

## 2016-03-26 MED FILL — PHENTERMINE 37.5 MG TABLET: 37.5 | 90 days supply | Qty: 90 | Fill #0

## 2016-03-26 MED FILL — AMPHETAMINE SALTS 20 MG TAB: 20 | 90 days supply | Qty: 90 | Fill #0

## 2016-03-26 MED FILL — DEXTROAMP-AMPHET ER 30 MG C: 30 | 90 days supply | Qty: 180 | Fill #0

## 2016-03-26 MED FILL — ABILIFY 2 MG TABLET: 2 | 30 days supply | Qty: 30 | Fill #0

## 2016-05-02 MED FILL — SYNTHROID 25 MCG TABLET: 25 | 90 days supply | Qty: 90 | Fill #0

## 2016-05-02 MED FILL — ELFOLATE 15 MG TAB: 15 | 90 days supply | Qty: 90 | Fill #0

## 2016-05-16 MED FILL — ARIPiprazole 2 MG TABS: 2 | 90 days supply | Qty: 90 | Fill #1

## 2016-05-20 MED FILL — ATOMOXETINE HCL 80 MG CAP: 80 | 90 days supply | Qty: 90 | Fill #0

## 2016-06-20 ENCOUNTER — Other Ambulatory Visit: Payer: Self-pay | Admitting: Family Medicine

## 2016-06-24 MED FILL — DESVENLAFAXINE SUC ER 100 M: 100 | 90 days supply | Qty: 90 | Fill #3

## 2016-07-08 MED FILL — DEXTROAMP-AMPHET ER 30 MG C: 30 | 90 days supply | Qty: 90 | Fill #0

## 2016-07-08 MED FILL — DEXTROAMP-AMPHETAMIN 20 MG: 20 | 90 days supply | Qty: 90 | Fill #0

## 2016-08-05 MED FILL — SYNTHROID 25 MCG TABLET: 25 | 90 days supply | Qty: 90 | Fill #1

## 2016-08-05 MED FILL — ELFOLATE 15 MG TAB: 15 | 90 days supply | Qty: 90 | Fill #1

## 2016-08-12 DIAGNOSIS — Z0100 Encounter for examination of eyes and vision without abnormal findings: Secondary | ICD-10-CM | POA: Diagnosis not present

## 2016-08-26 MED FILL — ATOMOXETINE HCL 80 MG CAP: 80 | 90 days supply | Qty: 90 | Fill #1

## 2016-08-26 MED FILL — ARIPiprazole 2 MG TABS: 2 | 90 days supply | Qty: 90 | Fill #2

## 2016-08-27 MED FILL — DEXTROAMP-AMPHET ER 30 MG C: 30 | 90 days supply | Qty: 180 | Fill #0

## 2016-09-05 ENCOUNTER — Telehealth: Payer: 59 | Admitting: Physician Assistant

## 2016-09-05 DIAGNOSIS — B9789 Other viral agents as the cause of diseases classified elsewhere: Principal | ICD-10-CM

## 2016-09-05 DIAGNOSIS — J069 Acute upper respiratory infection, unspecified: Secondary | ICD-10-CM

## 2016-09-05 MED ORDER — BENZONATATE 100 MG PO CAPS: 100.0000 mg | ORAL_CAPSULE | Freq: Three times a day (TID) | ORAL | 0 refills | Status: DC | PRN

## 2016-09-05 MED FILL — BENZONATATE 100 MG CAPSULE: 100 | 6 days supply | Qty: 20 | Fill #0

## 2016-09-05 NOTE — Progress Notes (Signed)
We are sorry that you are not feeling well.  Here is how we plan to help!  Based on what you have shared with me it looks like you have upper respiratory tract inflammation that has resulted in a significant cough.  Inflammation and infection in the upper respiratory tract is commonly called bronchitis and has four common causes:  Allergies, Viral Infections, Acid Reflux and Bacterial Infections.  Allergies, viruses and acid reflux are treated by controlling symptoms or eliminating the cause. An example might be a cough caused by taking certain blood pressure medications. You stop the cough by changing the medication. Another example might be a cough caused by acid reflux. Controlling the reflux helps control the cough.  Based on your presentation I believe you most likely have A cough due to a virus.  This is called viral bronchitis and is best treated by rest, plenty of fluids and control of the cough.  You may use Ibuprofen or Tylenol as directed to help your symptoms.    In addition you may use A prescription cough medication called Tessalon Perles 100mg. You may take 1-2 capsules every 8 hours as needed for your cough.    HOME CARE . Only take medications as instructed by your medical team. . Complete the entire course of an antibiotic. . Drink plenty of fluids and get plenty of rest. . Avoid close contacts especially the very young and the elderly . Cover your mouth if you cough or cough into your sleeve. . Always remember to wash your hands . A steam or ultrasonic humidifier can help congestion.    GET HELP RIGHT AWAY IF: . You develop worsening fever. . You become short of breath . You cough up blood. . Your symptoms persist after you have completed your treatment plan MAKE SURE YOU   Understand these instructions.  Will watch your condition.  Will get help right away if you are not doing well or get worse.  Your e-visit answers were reviewed by a board certified advanced  clinical practitioner to complete your personal care plan.  Depending on the condition, your plan could have included both over the counter or prescription medications. If there is a problem please reply  once you have received a response from your provider. Your safety is important to us.  If you have drug allergies check your prescription carefully.    You can use MyChart to ask questions about today's visit, request a non-urgent call back, or ask for a work or school excuse for 24 hours related to this e-Visit. If it has been greater than 24 hours you will need to follow up with your provider, or enter a new e-Visit to address those concerns. You will get an e-mail in the next two days asking about your experience.  I hope that your e-visit has been valuable and will speed your recovery. Thank you for using e-visits.   

## 2016-09-23 MED FILL — DESVENLAFAXINE SUC ER 100 M: 100 | 90 days supply | Qty: 90 | Fill #4

## 2016-10-17 ENCOUNTER — Ambulatory Visit (INDEPENDENT_AMBULATORY_CARE_PROVIDER_SITE_OTHER): Payer: 59 | Admitting: Adult Health

## 2016-10-17 ENCOUNTER — Encounter: Payer: Self-pay | Admitting: Adult Health

## 2016-10-17 VITALS — BP 122/64 | Temp 98.2°F | Ht 64.0 in | Wt 207.5 lb

## 2016-10-17 DIAGNOSIS — H9202 Otalgia, left ear: Secondary | ICD-10-CM | POA: Diagnosis not present

## 2016-10-17 NOTE — Progress Notes (Signed)
Subjective:    Patient ID: Lindsey Davis, female    DOB: 06/03/1961, 55 y.o.   MRN: [id number]  Otalgia   There is pain in the left ear. This is a new problem. The current episode started in the past 7 days. The problem occurs constantly. The problem has been unchanged. There has been no fever. Pertinent negatives include no abdominal pain, coughing, diarrhea, ear discharge, headaches, hearing loss, neck pain, rash, rhinorrhea, sore throat or vomiting. She has tried acetaminophen for the symptoms. The treatment provided mild relief.      Review of Systems  Constitutional: Negative.   HENT: Positive for ear pain. Negative for congestion, ear discharge, hearing loss, rhinorrhea, sinus pressure, sneezing, sore throat and tinnitus.   Eyes: Negative.   Respiratory: Negative for cough.   Gastrointestinal: Negative for abdominal pain, diarrhea and vomiting.  Musculoskeletal: Negative for neck pain.  Skin: Negative for rash.  Neurological: Negative for headaches.  All other systems reviewed and are negative.  Past Medical History:  Diagnosis Date  . Allergy   . Anemia   . Asthma   . Fibrocystic breast   . Iron deficiency anemia 09/20/2010   Resolved post menopause.      Social History   Social History  . Marital status: Married    Spouse name: N/A  . Number of children: N/A  . Years of education: N/A   Occupational History  . Not on file.   Social History Main Topics  . Smoking status: Former Smoker    Packs/day: 1.00    Years: 10.00    Types: Cigarettes    Quit date: 12/16/1998  . Smokeless tobacco: Never Used  . Alcohol use No  . Drug use: No  . Sexual activity: Not on file   Other Topics Concern  . Not on file   Social History Narrative   Married 1990. 3 kids (1 set of twins 3718 in 2017, oldest 4823- started nursing school).       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      Hobbies: enjoys exercise    Past Surgical History:  Procedure Laterality Date  . WRIST SURGERY      Family History  Problem Relation Age of Onset  . Breast cancer Mother     6970  . Urinary tract infection Mother     chronic  . Breast cancer Sister     early 5250s  . Heart attack Father     2673  . Cancer Paternal Grandmother     No Known Allergies  Current Outpatient Prescriptions on File Prior to Visit  Medication Sig Dispense Refill  . albuterol (PROVENTIL HFA;VENTOLIN HFA) 108 (90 BASE) MCG/ACT inhaler Inhale 2 puffs into the lungs every 6 (six) hours as needed for wheezing or shortness of breath (cough, shortness of breath or wheezing.). (Patient not taking: Reported on 01/17/2016) 1 Inhaler 1  . amphetamine-dextroamphetamine (ADDERALL) 20 MG tablet Take 20 mg by mouth as needed.    Marland Kitchen. amphetamine-dextroamphetamine (ADDERALL) 30 MG tablet Take 30 mg by mouth daily.    . ARIPiprazole (ABILIFY) 2 MG tablet Take 2 mg by mouth daily.    . benzonatate (TESSALON) 100 MG capsule Take 1 capsule (100 mg total) by mouth 3 (three) times daily as needed for cough. 20 capsule 0  . desvenlafaxine (  PRISTIQ) 100 MG 24 hr tablet Take 100 mg by mouth daily.    . Levothyroxine Sodium (SYNTHROID PO) Take 1 tablet by mouth daily.    Marland Kitchen. STRATTERA 80 MG capsule Through psychiatry. In addition to Adderall per patient    . Vitamin D, Ergocalciferol, (DRISDOL) 50000 units CAPS capsule Once a week per psychiatry. Dr. Evelene CroonKaur draws labs     No current facility-administered medications on file prior to visit.     BP 122/64   Temp 98.2 F (36.8 C) (Oral)   Ht 5\' 4"  (1.626 m)   Wt 207 lb 8 oz (94.1 kg)   LMP 03/31/2011   BMI 35.62 kg/m       Objective:   Physical Exam  Constitutional: She is oriented to person, place, and time. She appears well-developed and well-nourished. No distress.  HENT:  Head: Normocephalic and atraumatic.    Right Ear: Hearing, external ear and ear canal normal. Tympanic membrane is bulging.  Left Ear: Hearing, external ear and ear canal normal. Tympanic membrane is bulging.  Nose: Nose normal. No mucosal edema or rhinorrhea. Right sinus exhibits no maxillary sinus tenderness and no frontal sinus tenderness. Left sinus exhibits no maxillary sinus tenderness and no frontal sinus tenderness.  Mouth/Throat: Uvula is midline, oropharynx is clear and moist and mucous membranes are normal. No oropharyngeal exudate, posterior oropharyngeal edema, posterior oropharyngeal erythema or tonsillar abscesses.  Eyes: Conjunctivae and EOM are normal. Pupils are equal, round, and reactive to light. Right eye exhibits no discharge. Left eye exhibits no discharge.  Neck: Normal range of motion. Neck supple. No thyromegaly present.  Lymphadenopathy:    She has no cervical adenopathy.  Neurological: She is alert and oriented to person, place, and time.  Skin: Skin is warm and dry. No rash noted. She is not diaphoretic. No erythema. No pallor.  Psychiatric: She has a normal mood and affect. Her behavior is normal. Judgment and thought content normal.  Nursing note and vitals reviewed.     Assessment & Plan:  1. Otalgia, left - No signs of infection. She does have fluid behind the left TM. Pfister to use Afrin for the next 2 days. She could also use Flonase or Sudafed - Follow-up if no improvement  Shirline Freesory Millee Denise, NP

## 2016-10-30 ENCOUNTER — Other Ambulatory Visit: Payer: Self-pay | Admitting: Family Medicine

## 2016-10-30 DIAGNOSIS — Z1231 Encounter for screening mammogram for malignant neoplasm of breast: Secondary | ICD-10-CM

## 2016-11-04 MED FILL — TRINTELLIX 20 MG TABLET: 20 | 30 days supply | Qty: 30 | Fill #0

## 2016-11-04 MED FILL — ELFOLATE 15 MG TAB: 15 | 90 days supply | Qty: 90 | Fill #2

## 2016-11-04 MED FILL — SYNTHROID 25 MCG TABLET: 25 | 90 days supply | Qty: 90 | Fill #2

## 2016-11-25 MED FILL — DEXTROAMP-AMPHET ER 30 MG C: 30 | 30 days supply | Qty: 60 | Fill #0

## 2016-12-03 MED FILL — TRINTELLIX 20 MG TABLET: 20 | 30 days supply | Qty: 30 | Fill #1

## 2016-12-03 MED FILL — ATOMOXETINE HCL 80 MG CAP: 80 | 90 days supply | Qty: 90 | Fill #2

## 2016-12-05 ENCOUNTER — Ambulatory Visit: Payer: 59

## 2016-12-26 MED FILL — ADDERALL XR 30 MG CAP SA: 30 | 30 days supply | Qty: 60 | Fill #0

## 2017-01-02 MED FILL — TRINTELLIX 20 MG TABLET: 20 | 30 days supply | Qty: 30 | Fill #2

## 2017-01-03 ENCOUNTER — Ambulatory Visit
Admission: RE | Admit: 2017-01-03 | Discharge: 2017-01-03 | Disposition: A | Discharge status: Home / Self Care | Payer: 59 | Source: Ambulatory Visit | Attending: Family Medicine | Admitting: Family Medicine

## 2017-01-03 ENCOUNTER — Ambulatory Visit: Payer: 59

## 2017-01-03 DIAGNOSIS — Z1231 Encounter for screening mammogram for malignant neoplasm of breast: Secondary | ICD-10-CM

## 2017-01-06 MED FILL — ARIPiprazole 2 MG TABS: 2 | 90 days supply | Qty: 90 | Fill #3

## 2017-01-24 MED FILL — ADDERALL XR 30 MG CAP SA: 30 | 30 days supply | Qty: 60 | Fill #0

## 2017-01-24 MED FILL — AMPHETAMINE SALTS 20 MG TAB: 20 | 30 days supply | Qty: 30 | Fill #0

## 2017-02-03 MED FILL — TRINTELLIX 20 MG TABLET: 20 | 30 days supply | Qty: 30 | Fill #3

## 2017-02-13 ENCOUNTER — Telehealth: Payer: Self-pay

## 2017-02-13 NOTE — Telephone Encounter (Signed)
Error

## 2017-02-26 MED FILL — AMPHETAMINE SALTS 20 MG TAB: 20 | 30 days supply | Qty: 30 | Fill #0

## 2017-02-27 MED FILL — ADDERALL XR 30 MG CAP SA: 30 | 30 days supply | Qty: 60 | Fill #0

## 2017-02-28 MED FILL — LEVOTHYROXINE 25 MCG TABLET: 25 | 90 days supply | Qty: 90 | Fill #0

## 2017-03-03 MED FILL — ATOMOXETINE HCL 80 MG CAP: 80 | 90 days supply | Qty: 90 | Fill #3

## 2017-03-03 MED FILL — TRINTELLIX 20 MG TABLET: 20 | 30 days supply | Qty: 30 | Fill #4

## 2017-03-10 MED FILL — MYDAYIS ER 50 MG CAPSULE: 50 | 30 days supply | Qty: 30 | Fill #0

## 2017-03-18 ENCOUNTER — Encounter: Payer: Self-pay | Admitting: Family Medicine

## 2017-03-18 ENCOUNTER — Ambulatory Visit (INDEPENDENT_AMBULATORY_CARE_PROVIDER_SITE_OTHER): Payer: 59 | Admitting: Family Medicine

## 2017-03-18 VITALS — BP 132/88 | HR 87 | Temp 98.7°F | Ht 64.5 in | Wt 205.2 lb

## 2017-03-18 DIAGNOSIS — Z79899 Other long term (current) drug therapy: Secondary | ICD-10-CM

## 2017-03-18 DIAGNOSIS — E785 Hyperlipidemia, unspecified: Secondary | ICD-10-CM

## 2017-03-18 DIAGNOSIS — E039 Hypothyroidism, unspecified: Secondary | ICD-10-CM | POA: Diagnosis not present

## 2017-03-18 DIAGNOSIS — Z0000 Encounter for general adult medical examination without abnormal findings: Secondary | ICD-10-CM | POA: Diagnosis not present

## 2017-03-18 DIAGNOSIS — Z8349 Family history of other endocrine, nutritional and metabolic diseases: Secondary | ICD-10-CM

## 2017-03-18 DIAGNOSIS — Z1159 Encounter for screening for other viral diseases: Secondary | ICD-10-CM | POA: Diagnosis not present

## 2017-03-18 DIAGNOSIS — Z114 Encounter for screening for human immunodeficiency virus [HIV]: Secondary | ICD-10-CM

## 2017-03-18 LAB — COMPREHENSIVE METABOLIC PANEL
ALK PHOS: 58 U/L (ref 39–117)
ALT: 15 U/L (ref 0–35)
AST: 15 U/L (ref 0–37)
Albumin: 4.5 g/dL (ref 3.5–5.2)
BUN: 12 mg/dL (ref 6–23)
CO2: 29 meq/L (ref 19–32)
CREATININE: 0.68 mg/dL (ref 0.40–1.20)
Calcium: 9.5 mg/dL (ref 8.4–10.5)
Chloride: 104 mEq/L (ref 96–112)
GFR: 94.97 mL/min (ref >60.00)
Glucose, Bld: 98 mg/dL (ref 70–99)
Potassium: 4.7 mEq/L (ref 3.5–5.1)
Sodium: 138 mEq/L (ref 135–145)
Total Bilirubin: 0.6 mg/dL (ref 0.2–1.2)
Total Protein: 6.8 g/dL (ref 6.0–8.3)

## 2017-03-18 LAB — CBC
HCT: 41.5 % (ref 36.0–46.0)
Hemoglobin: 14.1 g/dL (ref 12.0–15.0)
MCHC: 34 g/dL (ref 30.0–36.0)
MCV: 87.8 fl (ref 78.0–100.0)
PLATELETS: 231 10*3/uL (ref 150.0–400.0)
RBC: 4.73 Mil/uL (ref 3.87–5.11)
RDW: 13.4 % (ref 11.5–15.5)
WBC: 4.5 10*3/uL (ref 4.0–10.5)

## 2017-03-18 LAB — LIPID PANEL
CHOL/HDL RATIO: 4
Cholesterol: 225 mg/dL — ABNORMAL HIGH (ref 0–200)
HDL: 61.1 mg/dL (ref >39.00)
LDL Cholesterol: 148 mg/dL — ABNORMAL HIGH (ref 0–99)
NonHDL: 164
Triglycerides: 82 mg/dL (ref 0.0–149.0)
VLDL: 16.4 mg/dL (ref 0.0–40.0)

## 2017-03-18 LAB — VITAMIN B12: Vitamin B-12: 613 pg/mL (ref 211–911)

## 2017-03-18 LAB — TSH: TSH: 2.4 u[IU]/mL (ref 0.35–4.50)

## 2017-03-18 LAB — VITAMIN D 25 HYDROXY (VIT D DEFICIENCY, FRACTURES): VITD: 22.54 ng/mL — ABNORMAL LOW (ref 30.00–100.00)

## 2017-03-18 MED ORDER — ALBUTEROL SULFATE HFA 108 (90 BASE) MCG/ACT IN AERS: 2.0000 | INHALATION_SPRAY | Freq: Four times a day (QID) | RESPIRATORY_TRACT | 5 refills | Status: DC | PRN

## 2017-03-18 MED FILL — VENTOLIN HFA 90 MCG INHALER: 108 (90 BAS | 25 days supply | Qty: 18 | Fill #0

## 2017-03-18 NOTE — Patient Instructions (Addendum)
Please stop by lab before you go. Thanks for fasting  Great job with weight loss- let sset another 10 lb goal- 195 on our scales by next year follow up

## 2017-03-18 NOTE — Progress Notes (Signed)
Pre visit review using our clinic review tool, if applicable. No additional management support is needed unless otherwise documented below in the visit note. 

## 2017-03-18 NOTE — Progress Notes (Signed)
Phone: [phone]  Subjective:  Patient presents today for their annual physical. Chief complaint-noted.   See problem oriented charting- ROS- full  review of systems was completed and negative including No chest pain or shortness of breath. No headache or blurry vision.   The following were reviewed and entered/updated in epic: Past Medical History:  Diagnosis Date  . Allergy   . Anemia   . Asthma   . Fibrocystic breast   . Iron deficiency anemia 09/20/2010   Resolved post menopause.     Patient Active Problem List   Diagnosis Date Noted  . Hypothyroidism 01/17/2016    Priority: Medium  . Obesity 10/21/2014    Priority: Medium  . Depression 03/28/2008    Priority: Medium  . Attention deficit disorder 03/28/2008    Priority: Medium  . Asthma 02/09/2008    Priority: Medium  . Allergic rhinitis 02/09/2008    Priority: Low   Past Surgical History:  Procedure Laterality Date  . WRIST SURGERY      Family History  Problem Relation Age of Onset  . Breast cancer Mother     23  . Urinary tract infection Mother     chronic  . Breast cancer Sister     early 10s  . Heart attack Father     22  . Cancer Paternal Grandmother     Medications- reviewed and updated Current Outpatient Prescriptions  Medication Sig Dispense Refill  . albuterol (PROVENTIL HFA;VENTOLIN HFA) 108 (90 Base) MCG/ACT inhaler Inhale 2 puffs into the lungs every 6 (six) hours as needed for wheezing or shortness of breath (cough, shortness of breath or wheezing.). 1 Inhaler 5  . amphetamine-dextroamphetamine (ADDERALL) 20 MG tablet Take 20 mg by mouth as needed.    Marland Kitchen amphetamine-dextroamphetamine (ADDERALL) 30 MG tablet Take 30 mg by mouth daily.    . ARIPiprazole (ABILIFY) 2 MG tablet Take 2 mg by mouth daily.    . Levothyroxine Sodium (SYNTHROID PO) Take 1 tablet by mouth daily.    . Vitamin D, Ergocalciferol, (DRISDOL) 50000 units CAPS capsule Once a week per psychiatry. Dr. Evelene Croon draws labs    .  vortioxetine HBr (TRINTELLIX) 20 MG TABS Take 20 mg by mouth daily.     No current facility-administered medications for this visit.     Allergies-reviewed and updated No Known Allergies  Social History   Social History  . Marital status: Married    Spouse name: N/A  . Number of children: N/A  . Years of education: N/A   Social History Main Topics  . Smoking status: Former Smoker    Packs/day: 1.00    Years: 10.00    Types: Cigarettes    Quit date: 12/16/1998  . Smokeless tobacco: Never Used  . Alcohol use No  . Drug use: No  . Sexual activity: Not Asked   Other Topics Concern  . None   Social History Narrative   Married 1990. 3 kids (1 set of twins 48 in 2017, oldest 53- started nursing school).       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      Hobbies: enjoys exercise    Objective: BP 132/88 (BP Location: Left Arm, Patient Position: Sitting, Cuff Size: Large)   Pulse 87   Temp 98.7 F (37.1 C) (Oral)   Ht 5' 4.5" (1.638 m)  Wt 205 lb 3.2 oz (93.1 kg)   LMP 03/31/2011   SpO2 97%   BMI 34.68 kg/m  Gen: NAD, resting comfortably HEENT: Mucous membranes are moist. Oropharynx normal Neck: no thyromegaly CV: RRR no murmurs rubs or gallops Lungs: CTAB no crackles, wheeze, rhonchi Abdomen: soft/nontender/nondistended/normal bowel sounds. No rebound or guarding.  Ext: no edema Skin: warm, dry Neuro: grossly normal, moves all extremities, PERRLA  Breasts: normal appearance, no masses or tenderness- denser than normal breast tissue Pelvic: declined  Assessment/Plan:  56 y.o. female presenting for annual physical.  Health Maintenance counseling: 1. Anticipatory guidance: Patient counseled regarding regular dental exams q6 months- just had a cap done, eye exams - yearly, wearing seatbelts.  2. Risk  factor reduction:  Advised patient of need for regular exercise and diet rich and fruits and vegetables to reduce risk of heart attack and stroke. Exercise- cardio 7 days a week walking- does weight trainer through [facility]. Diet-reasonable- but thinks exercise has been the big change. Try for 195 by next year follow up  Wt Readings from Last 3 Encounters:  03/18/17 205 lb 3.2 oz (93.1 kg)  10/17/16 207 lb 8 oz (94.1 kg)  01/17/16 217 lb (98.4 kg)  3. Immunizations/screenings/ancillary studies Immunization History  Administered Date(s) Administered  . Influenza Whole 09/10/2008  . Influenza-Unspecified 08/16/2014, 11/01/2015  . Tdap 03/25/2013   Health Maintenance Due  Topic Date Due  . Hepatitis C Screening - screen today 1961-10-04  . HIV Screening - screen today 09/26/1975   4. Cervical cancer screening- 01/17/16 with 3 year follow up. Last period about 5 years ago.  5. Breast cancer screening-  breast exam today normal and mammogram 12/24/16  6. Colon cancer screening - 12/25/2009 with 10 year follow up 7. Skin cancer screening/prevention- advised regular sunscreen use  Status of chronic or acute concerns   Asthma- rarely using albuterol. Has not used in a while in fact even pre exercise.   Depression- follows with psychiatry Dr. Evelene Croon and on trintellix and abilify. Had been on pristiq in the past.  ADD controlled on adderall  ,  in PM as needed. Off Strattera now  Hypothyroidism- controlled on levothyroxine - Dr. Evelene Croon follows labs  Mild hyperlipidemia- working on weight loss  1 year physical  Orders Placed This Encounter  Procedures  . Vitamin B12  . VITAMIN D 25 Hydroxy (Vit-D Deficiency, Fractures)    [place]  . TSH    [place]  . Folate RBC  . CBC    [place]  . Comprehensive metabolic panel        Order Specific Question:   Has the patient fasted?    Answer:   No  . Lipid panel        Order Specific Question:   Has the patient fasted?    Answer:    No  . Hepatitis C antibody, reflex    [facility]  . HIV antibody    Meds ordered this encounter  Medications  . vortioxetine HBr (TRINTELLIX) 20 MG TABS    Sig: Take 20 mg by mouth daily.  Marland Kitchen albuterol (PROVENTIL HFA;VENTOLIN HFA) 108 (90 Base) MCG/ACT inhaler    Sig: Inhale 2 puffs into the lungs every 6 (six) hours as needed for wheezing or shortness of breath (cough, shortness of breath or wheezing.).    Dispense:  1 Inhaler    Refill:  5    Return precautions advised.   Tana Conch, MD

## 2017-03-19 LAB — HEPATITIS C ANTIBODY: HCV Ab: NEGATIVE

## 2017-03-19 LAB — HIV ANTIBODY (ROUTINE TESTING W REFLEX): HIV 1&2 Ab, 4th Generation: NONREACTIVE

## 2017-03-20 LAB — FOLATE RBC: RBC FOLATE: 870 ng/mL (ref >280)

## 2017-04-02 MED FILL — ADDERALL XR 30 MG CAP SA: 30 | 90 days supply | Qty: 180 | Fill #0

## 2017-04-02 MED FILL — DEXTROAMP-AMPHETAMIN 20 MG: 20 | 90 days supply | Qty: 90 | Fill #0

## 2017-04-07 MED FILL — ARIPiprazole 2 MG TABS: 2 | 90 days supply | Qty: 90 | Fill #0

## 2017-04-08 MED FILL — TRINTELLIX 20 MG TABLET: 20 | 90 days supply | Qty: 90 | Fill #0

## 2017-07-14 MED FILL — ADDERALL XR 30 MG CAP SA: 30 | 30 days supply | Qty: 60 | Fill #0

## 2017-07-14 MED FILL — VENTOLIN HFA 90 MCG INHALER: 108 (90 BAS | 25 days supply | Qty: 18 | Fill #1

## 2017-07-14 MED FILL — DEXTROAMP-AMPHETAMIN 20 MG: 20 | 30 days supply | Qty: 30 | Fill #0

## 2017-07-14 MED FILL — TRINTELLIX 20 MG TABLET: 20 | 30 days supply | Qty: 30 | Fill #1

## 2017-07-15 MED FILL — LEVOTHYROXINE 25 MCG TABLET: 25 | 30 days supply | Qty: 30 | Fill #0

## 2017-07-15 MED FILL — ARIPiprazole 2 MG TABS: 2 | 30 days supply | Qty: 30 | Fill #1

## 2017-08-11 MED FILL — TRINTELLIX 20 MG TABLET: 20 | 30 days supply | Qty: 30 | Fill #2

## 2017-08-11 MED FILL — ARIPiprazole 2 MG TABS: 2 | 30 days supply | Qty: 30 | Fill #2

## 2017-08-11 MED FILL — LEVOTHYROXINE 25 MCG TABLET: 25 | 30 days supply | Qty: 30 | Fill #1

## 2017-08-13 MED FILL — ADDERALL XR 30 MG CAP SA: 30 | 30 days supply | Qty: 60 | Fill #0

## 2017-09-04 ENCOUNTER — Telehealth: Payer: Self-pay | Admitting: Family Medicine

## 2017-09-04 MED ORDER — BENZONATATE 100 MG PO CAPS: 100.0000 mg | ORAL_CAPSULE | Freq: Three times a day (TID) | ORAL | 0 refills | Status: DC | PRN

## 2017-09-04 NOTE — Telephone Encounter (Signed)
Yes thanks, may refill 

## 2017-09-04 NOTE — Telephone Encounter (Signed)
Please advise 

## 2017-09-04 NOTE — Telephone Encounter (Signed)
Medication filled to pharmacy as requested.  Discussed sig w/ Dr. Durene Cal: TID PRN, #20, 0 RF Called patient and left detailed message (per DPR) notifying her.

## 2017-09-04 NOTE — Telephone Encounter (Signed)
Pt would like a refill of  Medication  benzonatate (TESSALON) 100 MG capsule [988]   Pt states her parents are moving in with her and she has been cleaning.  Got into a lot of dust which is causing her to cough. Pt states she was prescribed this med before and it really helped. Pt declined appt at this time.

## 2017-09-05 MED FILL — BENZONATATE 100 MG CAP: 100 | 7 days supply | Qty: 20 | Fill #0

## 2017-09-10 MED FILL — DEXTROAMP-AMPHETAMIN 20 MG: 20 | 7 days supply | Qty: 7 | Fill #0

## 2017-09-10 MED FILL — ADDERALL XR 30 MG CAP SA: 30 | 7 days supply | Qty: 14 | Fill #0

## 2017-10-20 ENCOUNTER — Ambulatory Visit (INDEPENDENT_AMBULATORY_CARE_PROVIDER_SITE_OTHER): Payer: BLUE CROSS/BLUE SHIELD

## 2017-10-20 DIAGNOSIS — Z23 Encounter for immunization: Secondary | ICD-10-CM | POA: Diagnosis not present

## 2017-10-20 NOTE — Progress Notes (Signed)
Patient in this afternoon for a flu Vaccine. Patient tolerated well. VIS given to patient.

## 2018-01-07 MED FILL — PHENTERMINE 37.5 MG TABLET: 37.5 | 90 days supply | Qty: 90 | Fill #0

## 2018-01-16 ENCOUNTER — Encounter: Payer: Self-pay | Admitting: Family Medicine

## 2018-01-16 ENCOUNTER — Ambulatory Visit: Payer: BLUE CROSS/BLUE SHIELD | Admitting: Family Medicine

## 2018-01-16 VITALS — BP 128/80 | HR 78 | Temp 97.9°F | Ht 64.5 in | Wt 199.6 lb

## 2018-01-16 DIAGNOSIS — R05 Cough: Secondary | ICD-10-CM

## 2018-01-16 DIAGNOSIS — E559 Vitamin D deficiency, unspecified: Secondary | ICD-10-CM | POA: Diagnosis not present

## 2018-01-16 DIAGNOSIS — R059 Cough, unspecified: Secondary | ICD-10-CM

## 2018-01-16 MED ORDER — AZITHROMYCIN 250 MG PO TABS: ORAL_TABLET | ORAL | 0 refills | Status: DC

## 2018-01-16 MED ORDER — METHYLPREDNISOLONE ACETATE 80 MG/ML IJ SUSP
80.0000 mg | Freq: Once | INTRAMUSCULAR | Status: AC
  Administered 2018-01-16: 80 mg via INTRAMUSCULAR

## 2018-01-16 MED ORDER — IPRATROPIUM BROMIDE 0.06 % NA SOLN: 2.0000 | Freq: Four times a day (QID) | NASAL | 0 refills | Status: DC

## 2018-01-16 MED ORDER — BENZONATATE 200 MG PO CAPS: 200.0000 mg | ORAL_CAPSULE | Freq: Two times a day (BID) | ORAL | 0 refills | Status: DC | PRN

## 2018-01-16 MED ORDER — VITAMIN D (ERGOCALCIFEROL) 1.25 MG (50000 UNIT) PO CAPS: 50000.0000 [IU] | ORAL_CAPSULE | ORAL | 0 refills | Status: DC

## 2018-01-16 MED FILL — BENZONATATE 200 MG CAP: 200 | 10 days supply | Qty: 20 | Fill #0

## 2018-01-16 MED FILL — IPRATROPIUM 0.06% SPRAY: 0.06 | 10 days supply | Qty: 15 | Fill #0

## 2018-01-16 MED FILL — AZITHROMYCIN 250 MG TAB: 250 | 5 days supply | Qty: 6 | Fill #0

## 2018-01-16 NOTE — Patient Instructions (Signed)
Start the atrovent.  Start tessalon for your cough.  Start the zpack if your symptoms worsen or do not improve in a few days.  Please stay well hydrated.  You can take tylenol and/or motrin as needed for low grade fever and pain.  Please let me know if your symptoms worsen or fail to improve.  Take care, Dr Parker  

## 2018-01-16 NOTE — Addendum Note (Signed)
Addended by: Koleen DistanceAGNER, Cherrish Vitali M on: 01/16/2018 01:45 PM   Modules accepted: Orders

## 2018-01-16 NOTE — Progress Notes (Signed)
    Subjective:  Lindsey Davis is a 57 y.o. female who presents today for same-day appointment with a chief complaint of cough.   HPI:  Cough, Acute Issue Symptom started about a week ago. Worsened over that time. Associated with rhinorrhea, malaise, sinus congestion, and sputum production. No fevers or chills. Tried dayquil which has not significantly seemed to help.  No obvious precipitating events.  No other obvious alleviating or aggravating factors.  ROS: Per HPI  PMH: She reports that she quit smoking about 19 years ago. Her smoking use included cigarettes. She has a 10.00 pack-year smoking history. she has never used smokeless tobacco. She reports that she does not drink alcohol or use drugs.  Objective:  Physical Exam: BP 128/80 (BP Location: Left Arm, Patient Position: Sitting, Cuff Size: Normal)   Pulse 78   Temp 97.9 F (36.6 C) (Oral)   Ht 5' 4.5" (1.638 m)   Wt 199 lb 9.6 oz (90.5 kg)   LMP 03/31/2011   SpO2 98%   BMI 33.73 kg/m   Gen: NAD, resting comfortably HEENT: Right TM with clear effusion.  Left TM with small amount of atherosclerosis and clear effusion.  Oropharynx erythematous without exudate.  Maxillary sinuses clear to transillumination bilaterally.  Nasal mucosa erythematous and boggy bilaterally.  No lymphadenopathy. CV: RRR with no murmurs appreciated Pulm: NWOB, CTAB with no crackles, wheezes, or rhonchi  Assessment/Plan:  Cough Likely secondary to viral URI. No signs of bacterial infection. Start atrovent for rhinorrhea/sinus congestion. Will give 80mg  IM depo-medrol for sore throat. Start tessalon for cough. Sent in a "pocket prescription" for azithromycin with strict instruction to not start unless symptoms worse or fail to improve within the next several days. Recommended tylenol and/or motrin as needed for low grade fever and pain. Encouraged good oral hydration. Return precautions reviewed. Follow up as needed.   Vitamin D Deficiency Sent in  refill for vit D 50,000IU weekly.  Katina Degreealeb M. Jimmey RalphParker, MD 01/16/2018 1:15 PM

## 2018-02-23 ENCOUNTER — Other Ambulatory Visit: Payer: Self-pay | Admitting: Family Medicine

## 2018-02-23 DIAGNOSIS — Z1231 Encounter for screening mammogram for malignant neoplasm of breast: Secondary | ICD-10-CM

## 2018-03-11 ENCOUNTER — Ambulatory Visit
Admission: RE | Admit: 2018-03-11 | Discharge: 2018-03-11 | Disposition: A | Discharge status: Home / Self Care | Payer: BLUE CROSS/BLUE SHIELD | Source: Ambulatory Visit | Attending: Family Medicine | Admitting: Family Medicine

## 2018-03-11 DIAGNOSIS — Z1231 Encounter for screening mammogram for malignant neoplasm of breast: Secondary | ICD-10-CM

## 2018-03-31 MED FILL — ADDERALL XR 30 MG CAP SA: 30 | 7 days supply | Qty: 14 | Fill #0

## 2018-03-31 MED FILL — DEXTROAMP-AMPHETAMIN 20 MG: 20 | 7 days supply | Qty: 7 | Fill #0

## 2018-04-02 ENCOUNTER — Telehealth: Payer: Self-pay | Admitting: Family Medicine

## 2018-04-02 NOTE — Telephone Encounter (Signed)
See note.  Copied from CRM [id number]. Topic: Inquiry >> Apr 02, 2018 11:42 AM Windy KalataMichael, Taylor L, NT wrote: Reason for CRM: patient is calling and states she gave Asher MuirJamie the wrong fax number earlier. States it is supposed to be [phone] please advise

## 2018-04-02 NOTE — Telephone Encounter (Signed)
Re faxed to corrected number

## 2018-04-03 NOTE — Telephone Encounter (Signed)
No fax received. Patient would like it refaxed, ATTN:Lindsey Davis

## 2018-04-06 NOTE — Telephone Encounter (Signed)
Forms refaxed as requested.  

## 2018-04-07 MED FILL — DEXTROAMP-AMPHETAMIN 20 MG: 20 | 7 days supply | Qty: 7 | Fill #0

## 2018-04-07 MED FILL — ADDERALL XR 30 MG CAP SA: 30 | 7 days supply | Qty: 14 | Fill #0

## 2018-04-20 MED FILL — ADDERALL XR 30 MG CAP SA: 30 | 30 days supply | Qty: 60 | Fill #0

## 2018-04-20 MED FILL — DEXTROAMP-AMPHETAMIN 20 MG: 20 | 30 days supply | Qty: 30 | Fill #0

## 2018-05-11 ENCOUNTER — Encounter: Payer: Self-pay | Admitting: Family Medicine

## 2018-05-20 MED FILL — ADDERALL XR 30 MG CAP SA: 30 | 30 days supply | Qty: 60 | Fill #0

## 2018-05-20 MED FILL — DEXTROAMP-AMPHETAMIN 20 MG: 20 | 30 days supply | Qty: 30 | Fill #0

## 2018-09-04 ENCOUNTER — Encounter: Payer: Self-pay | Admitting: Physician Assistant

## 2018-09-04 ENCOUNTER — Ambulatory Visit (INDEPENDENT_AMBULATORY_CARE_PROVIDER_SITE_OTHER): Payer: 59 | Admitting: Physician Assistant

## 2018-09-04 VITALS — BP 120/80 | HR 78 | Temp 97.8°F | Ht 64.5 in | Wt 213.4 lb

## 2018-09-04 DIAGNOSIS — H6593 Unspecified nonsuppurative otitis media, bilateral: Secondary | ICD-10-CM | POA: Diagnosis not present

## 2018-09-04 DIAGNOSIS — J0100 Acute maxillary sinusitis, unspecified: Secondary | ICD-10-CM

## 2018-09-04 DIAGNOSIS — R05 Cough: Secondary | ICD-10-CM | POA: Diagnosis not present

## 2018-09-04 MED ORDER — AMOXICILLIN-POT CLAVULANATE 875-125 MG PO TABS: 1.0000 | ORAL_TABLET | Freq: Two times a day (BID) | ORAL | 0 refills | Status: DC

## 2018-09-04 MED ORDER — FLUTICASONE PROPIONATE 50 MCG/ACT NA SUSP: 2.0000 | Freq: Every day | NASAL | 2 refills | Status: DC

## 2018-09-04 MED ORDER — METHYLPREDNISOLONE ACETATE 80 MG/ML IJ SUSP
80.0000 mg | Freq: Once | INTRAMUSCULAR | Status: AC
  Administered 2018-09-04: 80 mg via INTRAMUSCULAR

## 2018-09-04 MED ORDER — ALBUTEROL SULFATE HFA 108 (90 BASE) MCG/ACT IN AERS: 2.0000 | INHALATION_SPRAY | Freq: Four times a day (QID) | RESPIRATORY_TRACT | 5 refills | Status: DC | PRN

## 2018-09-04 NOTE — Progress Notes (Signed)
Lindsey Davis is a 57 y.o. female here for recurrent problem.  I acted as a Neurosurgeonscribe for Energy East CorporationSamantha Thoma Paulsen, PA-C Corky Mullonna Orphanos, LPN   History of Present Illness:   Chief Complaint  Patient presents with  . Sinus Problem    Sinus Problem  This is a chronic problem. Episode onset: Started a month ago, was treated with Doxycycline on 9/8 finished, then went to [facility] 9/15 to have ears checked and was put on Prednisone  The problem has been gradually worsening since onset. There has been no fever. Her pain is at a severity of 3/10. The pain is mild. Associated symptoms include congestion, coughing, ear pain and sinus pressure. Pertinent negatives include no chills, headaches, hoarse voice, neck pain, shortness of breath, sneezing, sore throat or swollen glands. Past treatments include antibiotics (Claritin D daily, Nasocort, Doxy, Prednisone). The treatment provided mild relief.     Past Medical History:  Diagnosis Date  . Allergy   . Anemia   . Asthma   . Fibrocystic breast   . Iron deficiency anemia 09/20/2010   Resolved post menopause.       Social History   Socioeconomic History  . Marital status: Married    Spouse name: Not on file  . Number of children: Not on file  . Years of education: Not on file  . Highest education level: Not on file  Occupational History  . Not on file  Social Needs  . Financial resource strain: Not on file  . Food insecurity:    Worry: Not on file    Inability: Not on file  . Transportation needs:    Medical: Not on file    Non-medical: Not on file  Tobacco Use  . Smoking status: Former Smoker    Packs/day: 1.00    Years: 10.00    Pack years: 10.00    Types: Cigarettes    Last attempt to quit: 12/16/1998    Years since quitting: 19.7  . Smokeless tobacco: Never Used  Substance and Sexual Activity  . Alcohol use: No    Alcohol/week: 0.0 standard drinks  . Drug use: No  . Sexual activity: Not on file  Lifestyle  . Physical activity:     Days per week: Not on file    Minutes per session: Not on file  . Stress: Not on file  Relationships  . Social connections:    Talks on phone: Not on file    Gets together: Not on file    Attends religious service: Not on file    Active member of club or organization: Not on file    Attends meetings of clubs or organizations: Not on file    Relationship status: Not on file  . Intimate partner violence:    Fear of current or ex partner: Not on file    Emotionally abused: Not on file    Physically abused: Not on file    Forced sexual activity: Not on file  Other Topics Concern  . Not on file  Social History Narrative   Married 1990. 3 kids (1 set of twins 5618 in 2017, oldest 7423- started nursing school).       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      Hobbies: enjoys exercise    Past Surgical History:  Procedure Laterality Date  .  WRIST SURGERY      Family History  Problem Relation Age of Onset  . Breast cancer Mother        51  . Urinary tract infection Mother        chronic  . Heart attack Father        27  . Breast cancer Sister        early 66s  . Cancer Paternal Grandmother     No Known Allergies  Current Medications:   Current Outpatient Medications:  .  amphetamine-dextroamphetamine (ADDERALL) 20 MG tablet, Take 20 mg by mouth as needed., Disp: , Rfl:  .  amphetamine-dextroamphetamine (ADDERALL) 30 MG tablet, Take 30 mg by mouth daily., Disp: , Rfl:  .  ARIPiprazole (ABILIFY) 2 MG tablet, Take 2 mg by mouth daily., Disp: , Rfl:  .  BIOTIN PO, Take 1 tablet by mouth daily., Disp: , Rfl:  .  Levothyroxine Sodium (SYNTHROID PO), Take 1 tablet by mouth daily., Disp: , Rfl:  .  vortioxetine HBr (TRINTELLIX) 20 MG TABS, Take 20 mg by mouth daily., Disp: , Rfl:  .  albuterol (PROVENTIL HFA;VENTOLIN  HFA) 108 (90 Base) MCG/ACT inhaler, Inhale 2 puffs into the lungs every 6 (six) hours as needed for wheezing or shortness of breath (cough, shortness of breath or wheezing.)., Disp: 1 Inhaler, Rfl: 5 .  amoxicillin-clavulanate (AUGMENTIN) 875-125 MG tablet, Take 1 tablet by mouth 2 (two) times daily., Disp: 20 tablet, Rfl: 0 .  fluticasone (FLONASE) 50 MCG/ACT nasal spray, Place 2 sprays into both nostrils daily., Disp: 16 g, Rfl: 2   Review of Systems:   Review of Systems  Constitutional: Negative for chills.  HENT: Positive for congestion, ear pain and sinus pressure. Negative for hoarse voice, sneezing and sore throat.   Respiratory: Positive for cough. Negative for shortness of breath.   Musculoskeletal: Negative for neck pain.  Neurological: Negative for headaches.    Vitals:   Vitals:   09/04/18 1044  BP: 120/80  Pulse: 78  Temp: 97.8 F (36.6 C)  TempSrc: Oral  SpO2: 97%  Weight: 213 lb 6.1 oz (96.8 kg)  Height: 5' 4.5" (1.638 m)     Body mass index is 36.06 kg/m.  Physical Exam:   Physical Exam  Constitutional: She appears well-developed. She is cooperative.  Non-toxic appearance. She does not have a sickly appearance. She does not appear ill. No distress.  HENT:  Head: Normocephalic and atraumatic.  Right Ear: External ear and ear canal normal. Tympanic membrane is erythematous. Tympanic membrane is not retracted and not bulging. A middle ear effusion is present.  Left Ear: External ear and ear canal normal. Tympanic membrane is erythematous. Tympanic membrane is not retracted and not bulging. A middle ear effusion is present.  Nose: Mucosal edema and rhinorrhea present. Right sinus exhibits maxillary sinus tenderness. Right sinus exhibits no frontal sinus tenderness. Left sinus exhibits maxillary sinus tenderness. Left sinus exhibits no frontal sinus tenderness.  Mouth/Throat: Uvula is midline and mucous membranes are normal. Posterior oropharyngeal erythema present.  No posterior oropharyngeal edema. Tonsils are 1+ on the right. Tonsils are 1+ on the left. No tonsillar exudate.  Eyes: Conjunctivae and lids are normal.  Neck: Trachea normal.  Cardiovascular: Normal rate, regular rhythm, S1 normal, S2 normal and normal heart sounds.  Pulmonary/Chest: Effort normal and breath sounds normal. She has no decreased breath sounds. She has no wheezes. She has no rhonchi. She has no rales.  Lymphadenopathy:    She has no  cervical adenopathy.  Neurological: She is alert.  Skin: Skin is warm, dry and intact.  Psychiatric: She has a normal mood and affect. Her speech is normal and behavior is normal.  Nursing note and vitals reviewed.   Assessment and Plan:    Shaquasha was seen today for sinus problem.  Diagnoses and all orders for this visit:  Acute maxillary sinusitis, recurrence not specified and Bilateral serous otitis media, unspecified chronicity No red flags on exam.  Will initiate Augmentin per orders. Received 80 mg depo-medrol injection in office and tolerated well. Discussed taking medications as prescribed. Reviewed return precautions including worsening fever, SOB, worsening cough or other concerns. Push fluids and rest. I recommend that patient follow-up if symptoms worsen or persist despite treatment x 7-10 days, sooner if needed. -     methylPREDNISolone acetate (DEPO-MEDROL) injection 80 mg  Other orders -     amoxicillin-clavulanate (AUGMENTIN) 875-125 MG tablet; Take 1 tablet by mouth 2 (two) times daily. -     albuterol (PROVENTIL HFA;VENTOLIN HFA) 108 (90 Base) MCG/ACT inhaler; Inhale 2 puffs into the lungs every 6 (six) hours as needed for wheezing or shortness of breath (cough, shortness of breath or wheezing.). -     fluticasone (FLONASE) 50 MCG/ACT nasal spray; Place 2 sprays into both nostrils daily.  . Reviewed expectations re: course of current medical issues. . Discussed self-management of symptoms. . Outlined signs and symptoms  indicating need for more acute intervention. . Patient verbalized understanding and all questions were answered. . See orders for this visit as documented in the electronic medical record. . Patient received an After-Visit Summary.  CMA or LPN served as scribe during this visit. History, Physical, and Plan performed by medical provider. The above documentation has been reviewed and is accurate and complete.  Jarold Motto, PA-C

## 2018-09-04 NOTE — Patient Instructions (Addendum)
It was great to see you!  Start the oral Augmentin antibiotic. I would also like to have you start flonase to help open up your sinuses better.   Push fluids and get plenty of rest.  Please return if you are not improving as expected, or if you have high fevers (>101.5) or difficulty swallowing or worsening productive cough.  Call clinic with questions.  I hope you start feeling better soon!

## 2018-09-15 ENCOUNTER — Other Ambulatory Visit: Payer: Self-pay | Admitting: Family Medicine

## 2018-09-15 ENCOUNTER — Other Ambulatory Visit: Payer: Self-pay | Admitting: *Deleted

## 2018-09-15 NOTE — Telephone Encounter (Signed)
Pt returned call in regards to request for Tessalon refill. Saw S. Worley 09/04/18 for sinuitis and prescribed Tessalon 100mg  capsules.  Pt states "I am feeling much better but still have a cough, only at night." States she has 1 tab left and is requesting refill.   If appropriate: [facility] [address]

## 2018-09-15 NOTE — Telephone Encounter (Signed)
Pt requesting new prescription for Benzonatate (Tessalon) 100 mg capsule  LOV:09/04/18 PCP: Dr. Durene Cal

## 2018-09-15 NOTE — Telephone Encounter (Signed)
Attempted to contact patient regarding new RX request left VM to call to call office.

## 2018-09-15 NOTE — Telephone Encounter (Signed)
Copied from CRM [id number]. Topic: Quick Communication - Rx Refill/Question >> Sep 15, 2018  4:11 PM Baldo Daub L wrote: Medication: benzonatate (TESSALON) 100 MG capsule  Has the patient contacted their pharmacy? No - needs new script (Agent: If no, request that the patient contact the pharmacy for the refill.) (Agent: If yes, when and what did the pharmacy advise?)  Preferred Pharmacy (with phone number or street name): [id number] [place], [place] - [address] [phone] (Phone) [phone] (Fax)  Agent: Please be advised that RX refills may take up to 3 business days. We ask that you follow-up with your pharmacy.

## 2018-09-16 MED ORDER — BENZONATATE 100 MG PO CAPS: 100.0000 mg | ORAL_CAPSULE | Freq: Two times a day (BID) | ORAL | 0 refills | Status: DC | PRN

## 2018-09-16 NOTE — Addendum Note (Signed)
Addended by: Jimmye Norman on: 09/16/2018 07:57 AM   Modules accepted: Orders

## 2018-09-16 NOTE — Telephone Encounter (Signed)
Rx refill for Tessalon capsules sent to pharmacy as requested.

## 2018-12-01 ENCOUNTER — Other Ambulatory Visit: Payer: Self-pay | Admitting: Physician Assistant

## 2019-02-08 ENCOUNTER — Other Ambulatory Visit: Payer: Self-pay | Admitting: Family Medicine

## 2019-02-08 DIAGNOSIS — Z1231 Encounter for screening mammogram for malignant neoplasm of breast: Secondary | ICD-10-CM

## 2019-03-15 ENCOUNTER — Ambulatory Visit: Payer: 59

## 2019-04-12 ENCOUNTER — Ambulatory Visit: Payer: 59

## 2019-05-24 ENCOUNTER — Other Ambulatory Visit: Payer: Self-pay

## 2019-05-24 ENCOUNTER — Ambulatory Visit
Admission: RE | Admit: 2019-05-24 | Discharge: 2019-05-24 | Disposition: A | Discharge status: Home / Self Care | Payer: 59 | Source: Ambulatory Visit | Attending: Family Medicine | Admitting: Family Medicine

## 2019-05-24 DIAGNOSIS — Z1231 Encounter for screening mammogram for malignant neoplasm of breast: Secondary | ICD-10-CM

## 2019-07-07 ENCOUNTER — Encounter: Payer: 59 | Admitting: Family Medicine

## 2019-07-27 ENCOUNTER — Encounter: Payer: Self-pay | Admitting: Family Medicine

## 2019-07-27 ENCOUNTER — Ambulatory Visit: Payer: Self-pay | Admitting: Family Medicine

## 2019-07-27 NOTE — Telephone Encounter (Signed)
Pt reports nasal "Stuffiness" last night along with sneezing. States this Am "Stuffiness worse and coughing up tan phlegm." States cough is mild. Also reports sinus tenderness and earache, both ears. Denies fever, no SOB, no travel , no known exposure. Reports h/o seasonal allergies; "I feel like this may be going in to bronchitis."   CAll transferred to practice, Hildred Alamin, for consideration of appt.  CB# [phone] Reason for Disposition . Earache  Answer Assessment - Initial Assessment Questions 1. ONSET: "When did the cough begin?"      This AM 2. SEVERITY: "How bad is the cough today?"      Mild 3. RESPIRATORY DISTRESS: "Describe your breathing."      WNL 4. FEVER: "Do you have a fever?" If so, ask: "What is your temperature, how was it measured, and when did it start?"     no 5. SPUTUM: "Describe the color of your sputum" (clear, white, yellow, green)     Tan  6. HEMOPTYSIS: "Are you coughing up any blood?" If so ask: "How much?" (flecks, streaks, tablespoons, etc.)     no 7. CARDIAC HISTORY: "Do you have any history of heart disease?" (e.g., heart attack, congestive heart failure)      8. LUNG HISTORY: "Do you have any history of lung disease?"  (e.g., pulmonary embolus, asthma, emphysema)     9. PE RISK FACTORS: "Do you have a history of blood clots?" (or: recent nrgery, recent prolonged travel, bedridden)     10. OTHER SYMPTOMS: "Do you have any other symptoms?" (e.g., runny nose, wheezing, chest pain)       Sinus tenderness, nose "Stuffy" earache both ears.  Protocols used: COUGH - ACUTE PRODUCTIVE-A-AH

## 2019-07-27 NOTE — Telephone Encounter (Signed)
Patient scheduled.

## 2019-07-28 ENCOUNTER — Encounter: Payer: Self-pay | Admitting: Family Medicine

## 2019-07-28 ENCOUNTER — Ambulatory Visit (INDEPENDENT_AMBULATORY_CARE_PROVIDER_SITE_OTHER): Payer: 59 | Admitting: Family Medicine

## 2019-07-28 VITALS — Temp 97.5°F

## 2019-07-28 DIAGNOSIS — J3489 Other specified disorders of nose and nasal sinuses: Secondary | ICD-10-CM

## 2019-07-28 DIAGNOSIS — R05 Cough: Secondary | ICD-10-CM | POA: Diagnosis not present

## 2019-07-28 DIAGNOSIS — R059 Cough, unspecified: Secondary | ICD-10-CM

## 2019-07-28 MED ORDER — PREDNISONE 50 MG PO TABS: ORAL_TABLET | ORAL | 0 refills | Status: DC

## 2019-07-28 MED ORDER — BENZONATATE 100 MG PO CAPS: 100.0000 mg | ORAL_CAPSULE | Freq: Two times a day (BID) | ORAL | 0 refills | Status: DC | PRN

## 2019-07-28 MED ORDER — AMOXICILLIN-POT CLAVULANATE 875-125 MG PO TABS: 1.0000 | ORAL_TABLET | Freq: Two times a day (BID) | ORAL | 0 refills | Status: DC

## 2019-07-28 NOTE — Progress Notes (Signed)
   Chief Complaint:  Lindsey Davis is a 58 y.o. female who presents today for a virtual office visit with a chief complaint of sinus pressure.   Assessment/Plan:  Sinus pressure/cough No red flags.  Patient will be getting rapid COVID testing later today and will let me know the results.  We will send in prednisone 50 mg x 5 days and Tessalon for cough.  We will also send a "pocket prescription" for Augmentin with strict instruction to not start unless symptoms worsen or do not improve in a few days. Discussed reasons to return to care. Follow up as needed.     Subjective:  HPI:  Sinus Pressure Started 2 days ago. Associated symptoms include rhinorrhea, headache, cough, sneeze, ear pain.  No fevers.  She has 1 sick contact at work who is being tested for, but her test results have not come back yet.  She has been drinking plenty of fluids and stay well-hydrated.  No other treatments tried. No other obvious alleviating or aggravating factors.  Symptoms seem to be worsening.  ROS: Per HPI  PMH: She reports that she quit smoking about 20 years ago. Her smoking use included cigarettes. She has a 10.00 pack-year smoking history. She has never used smokeless tobacco. She reports that she does not drink alcohol or use drugs.      Objective/Observations  Physical Exam: Gen: NAD, resting comfortably Pulm: Normal work of breathing Neuro: Grossly normal, moves all extremities Psych: Normal affect and thought content  Virtual Visit via Video   I connected with Lindsey Davis on 07/28/19 at 11:00 AM EDT by a video enabled telemedicine application and verified that I am speaking with the correct person using two identifiers. I discussed the limitations of evaluation and management by telemedicine and the availability of in person appointments. The patient expressed understanding and agreed to proceed.   Patient location: Home Provider location: [place] participating  in the virtual visit: Myself and Patient     Algis Greenhouse. Jerline Pain, MD 07/28/2019 11:16 AM

## 2019-07-28 NOTE — Telephone Encounter (Signed)
Will be addressed during virtual visit with Dr. Jerline Pain today, pt aware.

## 2019-08-06 ENCOUNTER — Encounter: Payer: Self-pay | Admitting: Family Medicine

## 2019-08-17 ENCOUNTER — Encounter: Payer: Self-pay | Admitting: Family Medicine

## 2019-08-17 ENCOUNTER — Ambulatory Visit (INDEPENDENT_AMBULATORY_CARE_PROVIDER_SITE_OTHER): Payer: 59 | Admitting: Family Medicine

## 2019-08-17 ENCOUNTER — Other Ambulatory Visit: Payer: Self-pay

## 2019-08-17 ENCOUNTER — Other Ambulatory Visit (HOSPITAL_COMMUNITY)
Admission: RE | Admit: 2019-08-17 | Discharge: 2019-08-17 | Disposition: A | Discharge status: Home / Self Care | Payer: 59 | Source: Ambulatory Visit | Attending: Family Medicine | Admitting: Family Medicine

## 2019-08-17 VITALS — BP 112/82 | HR 77 | Temp 98.2°F | Ht 64.5 in | Wt 202.4 lb

## 2019-08-17 DIAGNOSIS — Z23 Encounter for immunization: Secondary | ICD-10-CM | POA: Diagnosis not present

## 2019-08-17 DIAGNOSIS — E785 Hyperlipidemia, unspecified: Secondary | ICD-10-CM

## 2019-08-17 DIAGNOSIS — J4520 Mild intermittent asthma, uncomplicated: Secondary | ICD-10-CM | POA: Diagnosis not present

## 2019-08-17 DIAGNOSIS — Z0000 Encounter for general adult medical examination without abnormal findings: Secondary | ICD-10-CM

## 2019-08-17 DIAGNOSIS — Z124 Encounter for screening for malignant neoplasm of cervix: Secondary | ICD-10-CM | POA: Diagnosis present

## 2019-08-17 DIAGNOSIS — F3342 Major depressive disorder, recurrent, in full remission: Secondary | ICD-10-CM | POA: Diagnosis not present

## 2019-08-17 DIAGNOSIS — E039 Hypothyroidism, unspecified: Secondary | ICD-10-CM

## 2019-08-17 DIAGNOSIS — E559 Vitamin D deficiency, unspecified: Secondary | ICD-10-CM | POA: Diagnosis not present

## 2019-08-17 DIAGNOSIS — E669 Obesity, unspecified: Secondary | ICD-10-CM

## 2019-08-17 LAB — CBC
HCT: 42.3 % (ref 36.0–46.0)
Hemoglobin: 14.1 g/dL (ref 12.0–15.0)
MCHC: 33.4 g/dL (ref 30.0–36.0)
MCV: 89.1 fl (ref 78.0–100.0)
Platelets: 240 10*3/uL (ref 150.0–400.0)
RBC: 4.74 Mil/uL (ref 3.87–5.11)
RDW: 14 % (ref 11.5–15.5)
WBC: 3.5 10*3/uL — ABNORMAL LOW (ref 4.0–10.5)

## 2019-08-17 LAB — COMPREHENSIVE METABOLIC PANEL
ALT: 19 U/L (ref 0–35)
AST: 15 U/L (ref 0–37)
Albumin: 4.4 g/dL (ref 3.5–5.2)
Alkaline Phosphatase: 49 U/L (ref 39–117)
BUN: 20 mg/dL (ref 6–23)
CO2: 28 mEq/L (ref 19–32)
Calcium: 9.6 mg/dL (ref 8.4–10.5)
Chloride: 103 mEq/L (ref 96–112)
Creatinine, Ser: 0.7 mg/dL (ref 0.40–1.20)
GFR: 85.68 mL/min (ref >60.00)
Glucose, Bld: 94 mg/dL (ref 70–99)
Potassium: 4.3 mEq/L (ref 3.5–5.1)
Sodium: 139 mEq/L (ref 135–145)
Total Bilirubin: 0.6 mg/dL (ref 0.2–1.2)
Total Protein: 6.7 g/dL (ref 6.0–8.3)

## 2019-08-17 LAB — LIPID PANEL
Cholesterol: 225 mg/dL — ABNORMAL HIGH (ref 0–200)
HDL: 57.8 mg/dL (ref >39.00)
LDL Cholesterol: 152 mg/dL — ABNORMAL HIGH (ref 0–99)
NonHDL: 166.8
Total CHOL/HDL Ratio: 4
Triglycerides: 74 mg/dL (ref 0.0–149.0)
VLDL: 14.8 mg/dL (ref 0.0–40.0)

## 2019-08-17 LAB — TSH: TSH: 2.44 u[IU]/mL (ref 0.35–4.50)

## 2019-08-17 LAB — VITAMIN D 25 HYDROXY (VIT D DEFICIENCY, FRACTURES): VITD: 39.62 ng/mL (ref 30.00–100.00)

## 2019-08-17 MED ORDER — ALBUTEROL SULFATE HFA 108 (90 BASE) MCG/ACT IN AERS: 2.0000 | INHALATION_SPRAY | Freq: Four times a day (QID) | RESPIRATORY_TRACT | 5 refills | Status: DC | PRN

## 2019-08-17 MED ORDER — FLUTICASONE PROPIONATE 50 MCG/ACT NA SUSP: NASAL | 2 refills | Status: DC

## 2019-08-17 NOTE — Patient Instructions (Addendum)
Health Maintenance Due  Topic Date Due  . PAP SMEAR-update today 01/16/2019  . INFLUENZA VACCINE - done today 07/17/2019   . Shingrix #1 today. Repeat injection in 2-5 months. 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 Not currently on anything. If low again would recommend ongoing 1000 units per day.   Please stop by lab before you go If you do not have mychart- we will call you about results within 5 business days of [place] receiving them.  If you have mychart- we will send your results within 3 business days of [place] receiving them.  If abnormal or we want to clarify a result, we will call or mychart you to make sure you receive the message.  If you have questions or concerns or don't hear within 5-7 days, please send [place] a message or call us.

## 2019-08-17 NOTE — Progress Notes (Signed)
Phone: [phone]   Subjective:  Patient presents today for their annual physical. Chief complaint-noted.   See problem oriented charting- ROS- full  review of systems was completed and negative except for: seasonal allergies  The following were reviewed and entered/updated in epic: Past Medical History:  Diagnosis Date  . Allergy   . Anemia   . Asthma   . Fibrocystic breast   . Iron deficiency anemia 09/20/2010   Resolved post menopause.     Patient Active Problem List   Diagnosis Date Noted  . Hypothyroidism 01/17/2016    Priority: Medium  . Obesity 10/21/2014    Priority: Medium  . Depression 03/28/2008    Priority: Medium  . Attention deficit disorder 03/28/2008    Priority: Medium  . Asthma 02/09/2008    Priority: Medium  . Allergic rhinitis 02/09/2008    Priority: Low   Past Surgical History:  Procedure Laterality Date  . WRIST SURGERY      Family History  Problem Relation Age of Onset  . Breast cancer Mother        76  . Urinary tract infection Mother        chronic  . Heart attack Father        38  . Breast cancer Sister        early 20s  . Cancer Paternal Grandmother     Medications- reviewed and updated Current Outpatient Medications  Medication Sig Dispense Refill  . albuterol (VENTOLIN HFA) 108 (90 Base) MCG/ACT inhaler Inhale 2 puffs into the lungs every 6 (six) hours as needed for wheezing or shortness of breath (cough, shortness of breath or wheezing.). 18 g 5  . amphetamine-dextroamphetamine (ADDERALL) 20 MG tablet Take 20 mg by mouth as needed.    Marland Kitchen amphetamine-dextroamphetamine (ADDERALL) 30 MG tablet Take 30 mg by mouth daily.    . ARIPiprazole (ABILIFY) 2 MG tablet Take 2 mg by mouth daily.    Marland Kitchen BIOTIN PO Take 1 tablet by mouth daily.    . fluticasone (FLONASE) 50 MCG/ACT nasal spray SPRAY 2 SPRAYS INTO EACH NOSTRIL EVERY DAY 48 g 2  . levothyroxine (SYNTHROID) 25 MCG tablet Take 1 tablet by mouth daily.     Marland Kitchen vortioxetine HBr  (TRINTELLIX) 20 MG TABS Take 20 mg by mouth daily.     No current facility-administered medications for this visit.     Allergies-reviewed and updated No Known Allergies  Social History   Social History Narrative   Married 1990. 3 kids (1 set of twins 49 in 2017, oldest 33- started nursing school).       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      Hobbies: enjoys exercise   Objective  Objective:  BP 112/82 (BP Location: Left Arm, Patient Position: Sitting, Cuff Size: Normal)   Pulse 77   Temp 98.2 F (36.8 C) (Temporal)   Ht 5' 4.5" (1.638 m)   Wt 202 lb 6.4 oz (91.8 kg)   LMP 03/31/2011   SpO2 98%   BMI 34.21 kg/m  Gen: NAD, resting comfortably HEENT: Mucous membranes are moist. Oropharynx normal Neck: no thyromegaly CV: RRR no murmurs rubs or gallops Lungs: CTAB no crackles, wheeze, rhonchi Abdomen: soft/nontender/nondistended/normal bowel sounds. No rebound or guarding.  Ext: no edema Skin: warm, dry Neuro: grossly normal, moves all extremities, PERRLA Breasts: normal appearance,  no masses or tenderness Pelvic: cervix normal in appearance, external genitalia normal, no adnexal masses or tenderness, uterus normal size, shape, and consistency and vagina normal without discharge   Assessment and Plan    58 y.o. female presenting for annual physical.  Health Maintenance counseling: 1. Anticipatory guidance: Patient counseled regarding regular dental exams -q6 months, eye exams -yearly ,  avoiding smoking and second hand smoke , limiting alcohol to 1 beverage per day .   2. Risk factor reduction:  Advised patient of need for regular exercise and diet rich and fruits and vegetables to reduce risk of heart attack and stroke. Exercise- jogging some and walking a lot with Raiford NobleRick, husband. Diet- has done a great job  losing 11 bs- with watching what shes eating and exercise.  Wt Readings from Last 3 Encounters:  08/17/19 202 lb 6.4 oz (91.8 kg)  09/04/18 213 lb 6.1 oz (96.8 kg)  01/16/18 199 lb 9.6 oz (90.5 kg)  3. Immunizations/screenings/ancillary studies- flu shot today. shingrix -start series today.  Immunization History  Administered Date(s) Administered  . Influenza Whole 09/10/2008  . Influenza,inj,Quad PF,6+ Mos 10/20/2017  . Influenza-Unspecified 08/16/2014, 11/01/2015  . Tdap 03/25/2013  4. Cervical cancer screening- due for updated pap smear- last 01/2016 5. Breast cancer screening-  breast exam today normal and mammogram 05/24/2019 6. Colon cancer screening -  10/2010 with 10 year follow up 7. Skin cancer screening- no dermatologist. advised regular sunscreen use. Denies worrisome, changing, or new skin lesions.  8. Birth control/STD check- postmenopausal and monogomous 9. Osteoporosis screening at 5265- will plan on this -former smoker- 10 pack years but quit in 2000s. UA next year  Status of chronic or acute concerns   # social- Tresa EndoKelly married recently- went really well. Work going well.   Asthma - Albuterol HFA prn- very inrequent use -tessalon has worked well for cough in past as well- agreed to call this in if needed in future- but want her to try albuterol first  Hypothyroidism - Taking Levothyroxine 25 mcg. Update tsh  Depression - Taking Abilify 2 mg daily and Trintellix 20 mg daily through Dr. Evelene CroonKaur. Doing well-continue current rx through him. phq9 of 0 today [facility] 08/17/2019  Decreased Interest 0  Down, Depressed, Hopeless 0  PHQ - 2 Score 0  Altered sleeping 0  Tired, decreased energy 0  Change in appetite 0  Feeling bad or failure about yourself  0  Trouble concentrating 0  Moving slowly or fidgety/restless 0  Suicidal thoughts 0  PHQ-9 Score 0  Difficult doing work/chores Not difficult at all   Attention Deficit Disorder - Taking Adderall 20 mg and  Adderall 30 mg. Doing well-c ontinue current rx through him  Vitamin D deficiency- was low 2 years ago. Not currently on anything. If low again would recommend ongoing 1000 units per day.   Allergies-   Recommended follow up: 1 year physical  Lab/Order associations:fasting    ICD-10-CM   1. Preventative health care  Z00.00 CBC    Comprehensive metabolic panel    Lipid panel    TSH    VITAMIN D 25 Hydroxy (Vit-D Deficiency, Fractures)  2. Hypothyroidism, unspecified type  E03.9 Lipid panel    TSH  3. Recurrent major depressive disorder, in full remission (HCC)  F33.42   4. Mild intermittent asthma without complication  J45.20   5. Hyperlipidemia, unspecified hyperlipidemia type  E78.5 CBC    Comprehensive metabolic panel    Lipid panel  TSH  6. Vitamin D deficiency  E55.9 VITAMIN D 25 Hydroxy (Vit-D Deficiency, Fractures)  7. Obesity (BMI 30-39.9)  E66.9     Meds ordered this encounter  Medications  . fluticasone (FLONASE) 50 MCG/ACT nasal spray    Sig: SPRAY 2 SPRAYS INTO EACH NOSTRIL EVERY DAY    Dispense:  48 g    Refill:  2  . albuterol (VENTOLIN HFA) 108 (90 Base) MCG/ACT inhaler    Sig: Inhale 2 puffs into the lungs every 6 (six) hours as needed for wheezing or shortness of breath (cough, shortness of breath or wheezing.).    Dispense:  18 g    Refill:  5    Return precautions advised.   Tana Conch, MD

## 2019-08-17 NOTE — Addendum Note (Signed)
Addended by: Jasper Loser on: 08/17/2019 09:11 AM   Modules accepted: Orders

## 2019-08-18 DIAGNOSIS — D72819 Decreased white blood cell count, unspecified: Secondary | ICD-10-CM

## 2019-08-18 LAB — CYTOLOGY - PAP: Diagnosis: NEGATIVE

## 2019-08-19 NOTE — Telephone Encounter (Signed)
Called pt to schedule lab appointment w/o answer. Left detailed message informing pt to call our office to schedule appt. Also, sent to [facility]. Labs orders have been placed!

## 2019-11-03 ENCOUNTER — Other Ambulatory Visit: Payer: Self-pay

## 2019-11-03 DIAGNOSIS — Z20822 Contact with and (suspected) exposure to covid-19: Secondary | ICD-10-CM

## 2019-11-05 LAB — NOVEL CORONAVIRUS, NAA: SARS-CoV-2, NAA: NOT DETECTED

## 2019-11-17 ENCOUNTER — Other Ambulatory Visit: Payer: Self-pay

## 2019-11-17 ENCOUNTER — Ambulatory Visit (INDEPENDENT_AMBULATORY_CARE_PROVIDER_SITE_OTHER): Payer: 59

## 2019-11-17 DIAGNOSIS — Z23 Encounter for immunization: Secondary | ICD-10-CM | POA: Diagnosis not present

## 2019-11-17 NOTE — Progress Notes (Signed)
Per orders of Dr. Yong Channel, injection of second Shingrix given by Serita Sheller in left deltoid. Patient tolerated injection well. Patient has completed her series of Shingrix.

## 2020-02-04 ENCOUNTER — Encounter: Payer: Self-pay | Admitting: Family Medicine

## 2020-02-04 ENCOUNTER — Other Ambulatory Visit: Payer: Self-pay

## 2020-02-04 ENCOUNTER — Ambulatory Visit (INDEPENDENT_AMBULATORY_CARE_PROVIDER_SITE_OTHER): Payer: 59 | Admitting: Family Medicine

## 2020-02-04 VITALS — BP 140/78 | HR 75 | Temp 96.3°F | Ht 64.5 in | Wt 187.8 lb

## 2020-02-04 DIAGNOSIS — S46319A Strain of muscle, fascia and tendon of triceps, unspecified arm, initial encounter: Secondary | ICD-10-CM

## 2020-02-04 MED ORDER — KETOROLAC TROMETHAMINE 60 MG/2ML IM SOLN
60.0000 mg | Freq: Once | INTRAMUSCULAR | Status: AC
  Administered 2020-02-04: 15:00:00 60 mg via INTRAMUSCULAR

## 2020-02-04 MED ORDER — BACLOFEN 10 MG PO TABS: 10.0000 mg | ORAL_TABLET | Freq: Three times a day (TID) | ORAL | 0 refills | Status: DC

## 2020-02-04 MED ORDER — PREDNISONE 20 MG PO TABS: 40.0000 mg | ORAL_TABLET | Freq: Every day | ORAL | 0 refills | Status: DC
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
## 2020-02-04 NOTE — Patient Instructions (Addendum)
I think you have a strain in your tricep or irritation/inflammation of your tendon.   -I want you to get voltaren over the counter gel and can rub onto sore area up to four times a day -continue with heat -sending in steroid burst for 5 days -toradol shot today. After shot can take motrin/advil/aleve etc 6 hours after shot, but would try voltaren gel first. Muscle relaxer you can take up to three times a day called baclofen.   If not better in 2 weeks, let us know. Would send to sports med for further work up.   Nice to meet you!  Dr. Artis Flock

## 2020-06-20 ENCOUNTER — Other Ambulatory Visit: Payer: Self-pay | Admitting: Family Medicine

## 2020-06-20 DIAGNOSIS — Z1231 Encounter for screening mammogram for malignant neoplasm of breast: Secondary | ICD-10-CM

## 2020-06-28 ENCOUNTER — Ambulatory Visit: Payer: 59

## 2020-07-26 ENCOUNTER — Ambulatory Visit: Payer: 59

## 2020-08-07 ENCOUNTER — Encounter: Payer: Self-pay | Admitting: Family Medicine

## 2020-08-07 ENCOUNTER — Ambulatory Visit (INDEPENDENT_AMBULATORY_CARE_PROVIDER_SITE_OTHER)
Admission: RE | Admit: 2020-08-07 | Discharge: 2020-08-07 | Disposition: A | Discharge status: Home / Self Care | Payer: 59 | Source: Ambulatory Visit | Attending: Family Medicine | Admitting: Family Medicine

## 2020-08-07 ENCOUNTER — Ambulatory Visit: Payer: 59 | Admitting: Family Medicine

## 2020-08-07 ENCOUNTER — Other Ambulatory Visit: Payer: Self-pay

## 2020-08-07 VITALS — BP 140/80 | HR 88 | Temp 98.6°F | Ht 64.5 in | Wt 181.0 lb

## 2020-08-07 DIAGNOSIS — M79672 Pain in left foot: Secondary | ICD-10-CM | POA: Diagnosis not present

## 2020-08-07 DIAGNOSIS — F3342 Major depressive disorder, recurrent, in full remission: Secondary | ICD-10-CM | POA: Diagnosis not present

## 2020-08-07 DIAGNOSIS — R030 Elevated blood-pressure reading, without diagnosis of hypertension: Secondary | ICD-10-CM | POA: Diagnosis not present

## 2020-08-07 NOTE — Patient Instructions (Addendum)
Health Maintenance Due  Topic Date Due  . COVID-19 Vaccine (1)Updated in chart  Never done  . INFLUENZA VACCINE Will get later in year.  07/16/2020   Recommended follow up: Return for as needed for new, worsening, persistent symptoms.  I have ordered xrays for you. At this time we do not have xrays in our clinic. You will have to go to our [facility]. The address is [address]  xray is located in the basement.  Hours of operation are M-F 8:30am to 5:00pm.  Closed for lunch between 12:30 and 1:00pm.   May consider boot if there is a fracture as long as minor

## 2020-08-07 NOTE — Progress Notes (Signed)
Phone [phone] In person visit   Subjective:   Lindsey Davis is a 59 y.o. year old very pleasant female patient who presents for/with See problem oriented charting Chief Complaint  Patient presents with  . Foot Swelling    x 1 week    This visit occurred during the SARS-CoV-2 public health emergency.  Safety protocols were in place, including screening questions prior to the visit, additional usage of staff PPE, and extensive cleaning of exam room while observing appropriate contact time as indicated for disinfecting solutions.   Past Medical History-  Patient Active Problem List   Diagnosis Date Noted  . Hypothyroidism 01/17/2016    Priority: Medium  . Obesity 10/21/2014    Priority: Medium  . Depression 03/28/2008    Priority: Medium  . Attention deficit disorder 03/28/2008    Priority: Medium  . Asthma 02/09/2008    Priority: Medium  . Allergic rhinitis 02/09/2008    Priority: Low    Medications- reviewed and updated Current Outpatient Medications  Medication Sig Dispense Refill  . acetaminophen (TYLENOL) 500 MG tablet Take 500 mg by mouth every 6 (six) hours as needed.    Marland Kitchen albuterol (VENTOLIN HFA) 108 (90 Base) MCG/ACT inhaler Inhale 2 puffs into the lungs every 6 (six) hours as needed for wheezing or shortness of breath (cough, shortness of breath or wheezing.). 18 g 5  . amphetamine-dextroamphetamine (ADDERALL) 20 MG tablet Take 20 mg by mouth as needed.    Marland Kitchen amphetamine-dextroamphetamine (ADDERALL) 30 MG tablet Take 30 mg by mouth daily.    . ARIPiprazole (ABILIFY) 2 MG tablet Take 2 mg by mouth daily.    Marland Kitchen BIOTIN PO Take 1 tablet by mouth daily.    . fluticasone (FLONASE) 50 MCG/ACT nasal spray SPRAY 2 SPRAYS INTO EACH NOSTRIL EVERY DAY 48 g 2  . levothyroxine (SYNTHROID) 25 MCG tablet Take 1 tablet by mouth daily.     . Multiple Vitamin (MULTIVITAMIN) capsule Take 1 capsule by mouth daily.    . traZODone (DESYREL) 50 MG tablet TAKE TWO TO THREE AT BEDTIME      . vortioxetine HBr (TRINTELLIX) 20 MG TABS Take 20 mg by mouth daily.     No current facility-administered medications for this visit.     Objective:  BP 140/80   Pulse 88   Temp 98.6 F (37 C) (Temporal)   Ht 5' 4.5" (1.638 m)   Wt 181 lb (82.1 kg)   LMP 03/31/2011   SpO2 98%   BMI 30.59 kg/m  Gen: NAD, resting comfortably Msk: Pain with palpation at distal portion of left fifth metatarsal as well as onto phalanges-noted some swelling in this area    Assessment and Plan   # Foot swelling /left fifth toe pain S:Patient had fall about a week ago. [place] and [place] fully fall but stumbled onto left 5th toe area in awkward position. She is still having some sselling and and discomfort. She has tried ice, elevation and tylenol. Not able to wear shoes due to swelling. Pain  Up to 4/10 at present. Also tried a biomat- seemed to help bruising (now gone) but not the pain A/P: I think we need to rule out a fracture in this area-could just have significant sprain or bone bruise.  Depending on if she has metatarsal or phalangeal fracture-would change management.  If any angulation or significant separation-may need orthopedic input.  May need to consider walking boot here  or at medical supply store  # Depression S: Medication: Abilify 2 mg, Trintellix Depression screen [facility] 2/9 08/07/2020 02/04/2020 08/17/2019  Decreased Interest 0 0 0  Down, Depressed, Hopeless 0 0 0  PHQ - 2 Score 0 0 0  Altered sleeping 0 0 0  Tired, decreased energy 0 0 0  Change in appetite 0 0 0  Feeling bad or failure about yourself  0 0 0  Trouble concentrating 0 0 0  Moving slowly or fidgety/restless 0 0 0  Suicidal thoughts 0 0 0  PHQ-9 Score 0 0 0  Difficult doing work/chores Not difficult at all Not difficult at all Not difficult at all  A/P: Depression remains in full remission with psychiatry-continue follow-up  #hypertension S: medication: None Home readings #s: Has  not checked recently BP Readings from Last 3 Encounters:  08/07/20 140/80  02/04/20 140/78  08/17/19 112/82  A/P: Patient is acutely in pain today-I asked her to check blood pressure once pain comes down.  Adderall could also run out blood pressure potentially-prescribed by psychiatry  Recommended follow up: Has physical scheduled in December Future Appointments  Date Time Provider Department Center  08/09/2020  4:40 PM [facility] 3 [facility]  12/14/2020  1:20 PM Shelva Majestic, MD [facility]    Lab/Order associations:   ICD-10-CM   1. Left foot pain  M79.672 DG Foot Complete Left  2. Recurrent major depressive disorder, in full remission (HCC)  F33.42   3. Elevated blood pressure reading  R03.0     Return precautions advised.  Tana Conch, MD

## 2020-08-09 ENCOUNTER — Ambulatory Visit
Admission: RE | Admit: 2020-08-09 | Discharge: 2020-08-09 | Disposition: A | Discharge status: Home / Self Care | Payer: 59 | Source: Ambulatory Visit | Attending: Family Medicine | Admitting: Family Medicine

## 2020-08-09 ENCOUNTER — Other Ambulatory Visit: Payer: Self-pay

## 2020-08-09 DIAGNOSIS — Z1231 Encounter for screening mammogram for malignant neoplasm of breast: Secondary | ICD-10-CM

## 2020-08-16 NOTE — Telephone Encounter (Signed)
Patient called in and stated she been wearing the boot and it is not helping she said it is still hurting and swollen. Patient wants to know if she should go see a orthopedic since it seems to not getting any better. Patient would like a call back to discuss

## 2020-08-18 ENCOUNTER — Other Ambulatory Visit: Payer: Self-pay

## 2020-08-18 ENCOUNTER — Ambulatory Visit: Payer: 59

## 2020-08-22 ENCOUNTER — Encounter: Payer: 59 | Admitting: Family Medicine

## 2020-11-16 NOTE — Patient Instructions (Addendum)
Please stop by lab before you go  Team need a urine before she leaves  If you have mychart- we will send your results within 3 business days of [place] receiving them.  If you do not have mychart- we will call you about results within 5 business days of [place] receiving them.  *please note we are currently using [facility] which has a longer processing time than [facility] typically so labs may not come back as quickly as in the past *please also note that you will see labs on mychart as soon as they post. I will later go in and write notes on them- will say "notes from Dr. Durene Cal"  We will call you within two weeks about your referral to GI. If you do not hear within 3 weeks, give [place] a call.   Schedule your bone density test at check out desk. You may also call directly to X-ray at [phone] to schedule an appointment that is convenient for you.  - located [address] across the street from [place] - in the basement - you do need an appointment for the bone density tests.

## 2020-11-16 NOTE — Progress Notes (Signed)
Phone [phone]   Subjective:  Patient presents today for their annual physical. Chief complaint-noted.   See problem oriented charting- ROS- full  review of systems was completed and negative per full ROS sheet  The following were reviewed and entered/updated in epic: Past Medical History:  Diagnosis Date  . Allergy   . Anemia   . Asthma   . Fibrocystic breast   . Iron deficiency anemia 09/20/2010   Resolved post menopause.     Patient Active Problem List   Diagnosis Date Noted  . Hypothyroidism 01/17/2016    Priority: Medium  . Obesity 10/21/2014    Priority: Medium  . Depression 03/28/2008    Priority: Medium  . Attention deficit disorder 03/28/2008    Priority: Medium  . Asthma 02/09/2008    Priority: Medium  . Allergic rhinitis 02/09/2008    Priority: Low   Past Surgical History:  Procedure Laterality Date  . WRIST SURGERY      Family History  Problem Relation Age of Onset  . Breast cancer Mother        65  . Urinary tract infection Mother        chronic  . Heart attack Father        63  . Breast cancer Sister        early 42s  . Cancer Paternal Grandmother     Medications- reviewed and updated Current Outpatient Medications  Medication Sig Dispense Refill  . acetaminophen (TYLENOL) 500 MG tablet Take 500 mg by mouth every 6 (six) hours as needed.    Marland Kitchen albuterol (VENTOLIN HFA) 108 (90 Base) MCG/ACT inhaler Inhale 2 puffs into the lungs every 6 (six) hours as needed for wheezing or shortness of breath (cough, shortness of breath or wheezing.). 18 g 5  . amphetamine-dextroamphetamine (ADDERALL XR) 30 MG 24 hr capsule Take 30 mg by mouth daily.    . ARIPiprazole (ABILIFY) 2 MG tablet Take 2 mg by mouth daily.    . Cholecalciferol (VITAMIN D3 PO) Take by mouth.    . levothyroxine (SYNTHROID) 50 MCG tablet Take 50 mcg by mouth daily.    . Melatonin 10 MG TABS Take by mouth.    . Multiple Vitamin (MULTIVITAMIN) capsule Take 1 capsule by mouth daily.      . traZODone (DESYREL) 50 MG tablet TAKE TWO TO THREE AT BEDTIME    . vortioxetine HBr (TRINTELLIX) 20 MG TABS Take 20 mg by mouth daily.     No current facility-administered medications for this visit.    Allergies-reviewed and updated No Known Allergies  Social History   Social History Narrative   Married 1990. 3 kids (1 set of twins 18 in 2017, oldest 87- started nursing school).       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      Hobbies: enjoys exercise   Objective  Objective:  BP 138/84 (BP Location: Left Arm, Patient Position: Sitting, Cuff Size: Normal)   Pulse 73   Temp 98.3 F (36.8 C) (Temporal)   Ht 5\' 5"  (1.651 m)   Wt 181 lb 3.2 oz (82.2 kg)   LMP 03/31/2011   SpO2 97%   BMI 30.15 kg/m  Gen: NAD, resting comfortably HEENT: Mucous membranes are moist. Oropharynx normal Neck: no thyromegaly CV: RRR no murmurs rubs or gallops Lungs: CTAB no crackles, wheeze,  rhonchi Abdomen: soft/nontender/nondistended/normal bowel sounds. No rebound or guarding.  Ext: no edema Skin: warm, dry Neuro: grossly normal, moves all extremities, PERRLA   Assessment and Plan   59 y.o. female presenting for annual physical.  Health Maintenance counseling: 1. Anticipatory guidance: Patient counseled regarding regular dental exams -q6 months, eye exams - yearly,  avoiding smoking and second hand smoke , limiting alcohol to 1 beverage per day .   2. Risk factor reduction:  Advised patient of need for regular exercise and diet rich and fruits and vegetables to reduce risk of heart attack and stroke. Exercise- did running of the turkeys on thanksgiving 5k first time in 5 years! Doing a lot of treadmill in the week and run on weekends (has been regular other than set back with toe). Diet-continues to do an amazing job with lifestyle  changes - down over 20 lbs in last year. Down from 202 last CPE Wt Readings from Last 3 Encounters:  11/17/20 181 lb 3.2 oz (82.2 kg)  08/07/20 181 lb (82.1 kg)  02/04/20 187 lb 12.8 oz (85.2 kg)  3. Immunizations/screenings/ancillary studies- fully up to date Immunization History  Administered Date(s) Administered  . Influenza Whole 09/10/2008  . Influenza,inj,Quad PF,6+ Mos 10/20/2017, 11/26/2018, 08/17/2019  . Influenza-Unspecified 08/16/2014, 11/01/2015, 10/16/2020  . PFIZER SARS-COV-2 Vaccination 12/27/2019, 01/17/2020, 11/08/2020  . Tdap 03/25/2013  . Zoster Recombinat (Shingrix) 08/17/2019, 11/17/2019  4. Cervical cancer screening- pap smear last year normal  with 3 year repeat planned 2023 5. Breast cancer screening-  breast exam today declined and mammogram 08/09/20 3d mammogram. Also does self exams 6. Colon cancer screening - colonoscopy 10/25/2010 with 10 year repeat- referred back today 7. Skin cancer screening- no dermatologist. advised regular sunscreen use. Denies worrisome, changing, or new skin lesions.  8. Birth control/STD check- postmenopausal and monogamous  9. Osteoporosis screening at 52- will get baseline this year -former smoker- 10 pack years but quit in 2000s.   Status of chronic or acute concerns   #caregiver burden- caring for mom and dad. Husband Raiford Noble able to help some thankfully.   # Asthma S: Maintenance Medication: none As needed medication: albuterol. Patient is using this 0 per week.  A/P: doing well but will refill in case she needs on hand.    #hypothyroidism S: compliant On thyroid medication- Synthroid Lab Results  Component Value Date   TSH 2.44 08/17/2019  A/P:check TSH- forward labs to Dr. Evelene Croon   # Depression- Dr. Evelene Croon manages #ADD- controlled with adderrall 30 mg XR S: Medication: trintellix, Abilify 2Mg , Trazadone 50Mg  Depression screen [facility] 2/9 11/17/2020 08/07/2020 02/04/2020  Decreased Interest 0 0 0  Down, Depressed,  Hopeless 0 0 0  PHQ - 2 Score 0 0 0  Altered sleeping 0 0 0  Tired, decreased energy 0 0 0  Change in appetite 0 0 0  Feeling bad or failure about yourself  0 0 0  Trouble concentrating 0 0 0  Moving slowly or fidgety/restless 0 0 0  Suicidal thoughts 0 0 0  PHQ-9 Score 0 0 0  Difficult doing work/chores Not difficult at all Not difficult at all Not difficult at all  A/P: doing well with current meds- will continue and continue follow up with Dr. 08/09/2020  Recommended follow up: Return in about 1 year (around 11/17/2021) for physical or sooner if needed.  Lab/Order associations: fasting   ICD-10-CM   1. Mild intermittent asthma without complication  J45.20   2. Hypothyroidism, unspecified type  E03.9  TSH    TSH  3. Recurrent major depressive disorder, in full remission (HCC)  F33.42   4. Preventative health care  Z00.00 COMPLETE METABOLIC PANEL WITH GFR    Lipid panel    CBC with Differential/Platelet    VITAMIN D 25 Hydroxy (Vit-D Deficiency, Fractures)    TSH    TSH    VITAMIN D 25 Hydroxy (Vit-D Deficiency, Fractures)    CBC with Differential/Platelet    Lipid panel    COMPLETE METABOLIC PANEL WITH GFR    POCT Urinalysis Dipstick (Automated)  5. Hyperlipidemia, unspecified hyperlipidemia type  E78.5 COMPLETE METABOLIC PANEL WITH GFR    Lipid panel    CBC with Differential/Platelet    TSH    TSH    CBC with Differential/Platelet    Lipid panel    COMPLETE METABOLIC PANEL WITH GFR  6. Vitamin D deficiency  E55.9 VITAMIN D 25 Hydroxy (Vit-D Deficiency, Fractures)    VITAMIN D 25 Hydroxy (Vit-D Deficiency, Fractures)  7. Encounter for screening colonoscopy  Z12.11 Ambulatory referral to Gastroenterology  8. Postmenopausal  Z78.0 DG Bone Density  9. Former smoker  Z87.891 POCT Urinalysis Dipstick (Automated)    Meds ordered this encounter  Medications  . albuterol (VENTOLIN HFA) 108 (90 Base) MCG/ACT inhaler    Sig: Inhale 2 puffs into the lungs every 6 (six) hours as  needed for wheezing or shortness of breath (cough, shortness of breath or wheezing.).    Dispense:  18 g    Refill:  5    Return precautions advised.  Tana Conch, MD

## 2020-11-17 ENCOUNTER — Other Ambulatory Visit: Payer: Self-pay

## 2020-11-17 ENCOUNTER — Ambulatory Visit (INDEPENDENT_AMBULATORY_CARE_PROVIDER_SITE_OTHER): Payer: 59 | Admitting: Family Medicine

## 2020-11-17 ENCOUNTER — Encounter: Payer: Self-pay | Admitting: Family Medicine

## 2020-11-17 VITALS — BP 138/84 | HR 73 | Temp 98.3°F | Ht 65.0 in | Wt 181.2 lb

## 2020-11-17 DIAGNOSIS — Z87891 Personal history of nicotine dependence: Secondary | ICD-10-CM | POA: Diagnosis not present

## 2020-11-17 DIAGNOSIS — E559 Vitamin D deficiency, unspecified: Secondary | ICD-10-CM

## 2020-11-17 DIAGNOSIS — E039 Hypothyroidism, unspecified: Secondary | ICD-10-CM | POA: Diagnosis not present

## 2020-11-17 DIAGNOSIS — J4520 Mild intermittent asthma, uncomplicated: Secondary | ICD-10-CM

## 2020-11-17 DIAGNOSIS — F3342 Major depressive disorder, recurrent, in full remission: Secondary | ICD-10-CM

## 2020-11-17 DIAGNOSIS — Z780 Asymptomatic menopausal state: Secondary | ICD-10-CM

## 2020-11-17 DIAGNOSIS — Z0000 Encounter for general adult medical examination without abnormal findings: Secondary | ICD-10-CM

## 2020-11-17 DIAGNOSIS — E785 Hyperlipidemia, unspecified: Secondary | ICD-10-CM

## 2020-11-17 DIAGNOSIS — Z1211 Encounter for screening for malignant neoplasm of colon: Secondary | ICD-10-CM

## 2020-11-17 LAB — POC URINALSYSI DIPSTICK (AUTOMATED)
Bilirubin, UA: NEGATIVE
Blood, UA: NEGATIVE
Glucose, UA: NEGATIVE
Ketones, UA: NEGATIVE
Nitrite, UA: NEGATIVE
Protein, UA: NEGATIVE
Spec Grav, UA: 1.025 (ref 1.010–1.025)
Urobilinogen, UA: 0.2 E.U./dL
pH, UA: 6 (ref 5.0–8.0)

## 2020-11-17 MED ORDER — ALBUTEROL SULFATE HFA 108 (90 BASE) MCG/ACT IN AERS: 2.0000 | INHALATION_SPRAY | Freq: Four times a day (QID) | RESPIRATORY_TRACT | 5 refills | Status: DC | PRN

## 2020-11-17 NOTE — Addendum Note (Signed)
Addended by: Daryll Brod on: 11/17/2020 05:09 PM   Modules accepted: Orders

## 2020-11-18 LAB — CBC WITH DIFFERENTIAL/PLATELET
Absolute Monocytes: 270 cells/uL (ref 200–950)
Basophils Absolute: 42 cells/uL (ref 0–200)
Basophils Relative: 0.8 %
Eosinophils Absolute: 31 cells/uL (ref 15–500)
Eosinophils Relative: 0.6 %
HCT: 39.7 % (ref 35.0–45.0)
Hemoglobin: 13.5 g/dL (ref 11.7–15.5)
Lymphs Abs: 1472 cells/uL (ref 850–3900)
MCH: 30.4 pg (ref 27.0–33.0)
MCHC: 34 g/dL (ref 32.0–36.0)
MCV: 89.4 fL (ref 80.0–100.0)
MPV: 10.2 fL (ref 7.5–12.5)
Monocytes Relative: 5.2 %
Neutro Abs: 3385 cells/uL (ref 1500–7800)
Neutrophils Relative %: 65.1 %
Platelets: 223 10*3/uL (ref 140–400)
RBC: 4.44 10*6/uL (ref 3.80–5.10)
RDW: 13 % (ref 11.0–15.0)
Total Lymphocyte: 28.3 %
WBC: 5.2 10*3/uL (ref 3.8–10.8)

## 2020-11-18 LAB — COMPLETE METABOLIC PANEL WITH GFR
AG Ratio: 2 (calc) (ref 1.0–2.5)
ALT: 17 U/L (ref 6–29)
AST: 18 U/L (ref 10–35)
Albumin: 4.5 g/dL (ref 3.6–5.1)
Alkaline phosphatase (APISO): 50 U/L (ref 37–153)
BUN: 20 mg/dL (ref 7–25)
CO2: 28 mmol/L (ref 20–32)
Calcium: 9.5 mg/dL (ref 8.6–10.4)
Chloride: 103 mmol/L (ref 98–110)
Creat: 0.59 mg/dL (ref 0.50–0.99)
GFR, Est African American: 115 mL/min/{1.73_m2} (ref >=60)
GFR, Est Non African American: 100 mL/min/{1.73_m2} (ref >=60)
Globulin: 2.2 g/dL (calc) (ref 1.9–3.7)
Glucose, Bld: 84 mg/dL (ref 65–99)
Potassium: 4.5 mmol/L (ref 3.5–5.3)
Sodium: 137 mmol/L (ref 135–146)
Total Bilirubin: 0.5 mg/dL (ref 0.2–1.2)
Total Protein: 6.7 g/dL (ref 6.1–8.1)

## 2020-11-18 LAB — LIPID PANEL
Cholesterol: 237 mg/dL — ABNORMAL HIGH (ref <200)
HDL: 71 mg/dL (ref >=50)
LDL Cholesterol (Calc): 149 mg/dL (calc) — ABNORMAL HIGH
Non-HDL Cholesterol (Calc): 166 mg/dL (calc) — ABNORMAL HIGH (ref <130)
Total CHOL/HDL Ratio: 3.3 (calc) (ref <5.0)
Triglycerides: 73 mg/dL (ref <150)

## 2020-11-18 LAB — VITAMIN D 25 HYDROXY (VIT D DEFICIENCY, FRACTURES): Vit D, 25-Hydroxy: 45 ng/mL (ref 30–100)

## 2020-11-18 LAB — TSH: TSH: 1.19 mIU/L (ref 0.40–4.50)

## 2020-12-14 ENCOUNTER — Encounter: Payer: 59 | Admitting: Family Medicine

## 2021-01-26 ENCOUNTER — Encounter: Payer: Self-pay | Admitting: Physician Assistant

## 2021-01-26 ENCOUNTER — Other Ambulatory Visit: Payer: Self-pay

## 2021-01-26 ENCOUNTER — Ambulatory Visit (INDEPENDENT_AMBULATORY_CARE_PROVIDER_SITE_OTHER): Payer: 59 | Admitting: Physician Assistant

## 2021-01-26 VITALS — BP 110/80 | HR 84 | Temp 98.3°F | Resp 14 | Ht 65.0 in | Wt 185.0 lb

## 2021-01-26 DIAGNOSIS — Z803 Family history of malignant neoplasm of breast: Secondary | ICD-10-CM

## 2021-01-26 DIAGNOSIS — N644 Mastodynia: Secondary | ICD-10-CM

## 2021-01-26 NOTE — Progress Notes (Signed)
Acute Office Visit  Subjective:    Patient ID: Lindsey Davis, female    DOB: 1961-06-13, 60 y.o.   MRN: [id number]  Chief Complaint  Patient presents with  . Breast Pain    Started about 2 months ago. Having some pain of the right breast. Pain is worst at night. She is able to feel the area. Has changed from wired bras to non-wires.     HPI Patient is in today for R sided breast pain. NKI. States it is an aching pain that is sometimes sharper in nature. Notices it when she is lying down at night. Intermittently takes Ibuprofen for it. Her mammogram is UTD as of 08/10/20 - normal. Mother and sister with history of breast cancer. Pt is a runner, but hasn't been doing much as recently and has not been lifting weights.   Past Medical History:  Diagnosis Date  . Allergy   . Anemia   . Asthma   . Fibrocystic breast   . Iron deficiency anemia 09/20/2010   Resolved post menopause.      Past Surgical History:  Procedure Laterality Date  . WRIST SURGERY      Family History  Problem Relation Age of Onset  . Breast cancer Mother        41  . Urinary tract infection Mother        chronic  . Heart attack Father        40  . Breast cancer Sister        early 33s  . Cancer Paternal Grandmother     Social History   Socioeconomic History  . Marital status: Married    Spouse name: Not on file  . Number of children: Not on file  . Years of education: Not on file  . Highest education level: Not on file  Occupational History  . Not on file  Tobacco Use  . Smoking status: Former Smoker    Packs/day: 1.00    Years: 10.00    Pack years: 10.00    Types: Cigarettes    Quit date: 12/16/1998    Years since quitting: 22.1  . Smokeless tobacco: Never Used  Substance and Sexual Activity  . Alcohol use: No    Alcohol/week: 0.0 standard drinks  . Drug use: No  . Sexual activity: Not on file  Other Topics Concern  . Not on file  Social History Narrative   Married 1990. 3 kids (1  set of twins 50 in 2017, oldest 64- started nursing school).       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      Hobbies: enjoys exercise   Social Determinants of Health   Financial Resource Strain: Not on file  Food Insecurity: Not on file  Transportation Needs: Not on file  Physical Activity: Not on file  Stress: Not on file  Social Connections: Not on file  Intimate Partner Violence: Not on file    Outpatient Medications Prior to Visit  Medication Sig Dispense Refill  . acetaminophen (TYLENOL) 500 MG tablet Take 500 mg by mouth every 6 (six) hours as needed.    Marland Kitchen albuterol (VENTOLIN HFA) 108 (90 Base) MCG/ACT inhaler Inhale 2 puffs into the lungs every 6 (six) hours as needed for wheezing or shortness of breath (cough, shortness of breath or wheezing.). 18  g 5  . amphetamine-dextroamphetamine (ADDERALL XR) 30 MG 24 hr capsule Take 30 mg by mouth daily.    Marland Kitchen amphetamine-dextroamphetamine (ADDERALL) 30 MG tablet TAKE ONE BY MOUTH EVERY MORNING AT 7 AM    . ARIPiprazole (ABILIFY) 2 MG tablet Take 2 mg by mouth daily.    . Cholecalciferol (VITAMIN D3 PO) Take by mouth.    . levothyroxine (SYNTHROID) 50 MCG tablet Take 50 mcg by mouth daily.    . Melatonin 10 MG TABS Take by mouth.    . Multiple Vitamin (MULTIVITAMIN) capsule Take 1 capsule by mouth daily.    . traZODone (DESYREL) 50 MG tablet TAKE TWO TO THREE AT BEDTIME    . vortioxetine HBr (TRINTELLIX) 20 MG TABS tablet Take 20 mg by mouth daily.     No facility-administered medications prior to visit.    No Known Allergies  Review of Systems  Constitutional: Negative for appetite change, chills, fatigue, fever and unexpected weight change.  Respiratory: Negative for cough, chest tightness, shortness of breath and wheezing.   Cardiovascular: Negative for chest pain.   Musculoskeletal:       R sided chest wall pain / breast pain, medially  Skin: Negative for rash.       Objective:    Physical Exam Vitals reviewed. Exam conducted with a chaperone present.  Constitutional:      Appearance: Normal appearance.  Cardiovascular:     Rate and Rhythm: Normal rate and regular rhythm.     Pulses: Normal pulses.     Heart sounds: Normal heart sounds.  Pulmonary:     Effort: Pulmonary effort is normal.     Breath sounds: Normal breath sounds.  Chest:     Chest wall: No mass, deformity or swelling.  Breasts:     Right: Normal. No swelling, mass, nipple discharge, axillary adenopathy or supraclavicular adenopathy.     Left: Normal. No swelling, mass, nipple discharge, axillary adenopathy or supraclavicular adenopathy.     Lymphadenopathy:     Upper Body:     Right upper body: No supraclavicular or axillary adenopathy.     Left upper body: No supraclavicular or axillary adenopathy.  Neurological:     Mental Status: She is alert.     BP 110/80   Pulse 84   Temp 98.3 F (36.8 C) (Temporal)   Resp 14   Ht 5\' 5"  (1.651 m)   Wt 185 lb (83.9 kg)   LMP 03/31/2011   SpO2 98%   BMI 30.79 kg/m  Wt Readings from Last 3 Encounters:  01/26/21 185 lb (83.9 kg)  11/17/20 181 lb 3.2 oz (82.2 kg)  08/07/20 181 lb (82.1 kg)     Lab Results  Component Value Date   TSH 1.19 11/17/2020   Lab Results  Component Value Date   WBC 5.2 11/17/2020   HGB 13.5 11/17/2020   HCT 39.7 11/17/2020   MCV 89.4 11/17/2020   PLT 223 11/17/2020   Lab Results  Component Value Date   NA 137 11/17/2020   K 4.5 11/17/2020   CO2 28 11/17/2020   GLUCOSE 84 11/17/2020   BUN 20 11/17/2020   CREATININE 0.59 11/17/2020   BILITOT 0.5 11/17/2020   ALKPHOS 49 08/17/2019   AST 18 11/17/2020   ALT 17 11/17/2020   PROT 6.7 11/17/2020   ALBUMIN 4.4 08/17/2019   CALCIUM 9.5 11/17/2020   GFR 85.68 08/17/2019   Lab Results  Component Value Date   CHOL 237 (H)  11/17/2020  Lab Results  Component Value Date   HDL 71 11/17/2020   Lab Results  Component Value Date   LDLCALC 149 (H) 11/17/2020   Lab Results  Component Value Date   TRIG 73 11/17/2020   Lab Results  Component Value Date   CHOLHDL 3.3 11/17/2020   No results found for: HGBA1C     Assessment & Plan:   Problem List Items Addressed This Visit   None   Visit Diagnoses    Breast pain, right    -  Primary   Relevant Orders   [place] Unlisted Procedure Breast   Family history of breast cancer in first degree relative       Relevant Orders   [place] Unlisted Procedure Breast     1. Breast pain, right 2 months of pain with no associated injury, will schedule for U/S at this time. In the meantime, she may try Aleve twice daily with food for about 7-10 days and be more aggressive with heat or ice to area. Limit any extra stress or lifting right now while in pain. Will call with U/S results once obtained.  2. Family history of breast cancer in first degree relative In sister and mother, supports reasoning for U/S in #1.   This visit occurred during the SARS-CoV-2 public health emergency.  Safety protocols were in place, including screening questions prior to the visit, additional usage of staff PPE, and extensive cleaning of exam room while observing appropriate contact time as indicated for disinfecting solutions.    Abdulrahim Siddiqi M Dewain Platz, PA-C

## 2021-01-26 NOTE — Patient Instructions (Signed)
Someone will call to schedule your ultrasound.

## 2021-01-30 ENCOUNTER — Other Ambulatory Visit: Payer: Self-pay | Admitting: Physician Assistant

## 2021-01-30 DIAGNOSIS — N644 Mastodynia: Secondary | ICD-10-CM

## 2021-01-30 DIAGNOSIS — Z803 Family history of malignant neoplasm of breast: Secondary | ICD-10-CM

## 2021-02-08 ENCOUNTER — Ambulatory Visit (AMBULATORY_SURGERY_CENTER): Payer: Self-pay

## 2021-02-08 ENCOUNTER — Other Ambulatory Visit: Payer: Self-pay

## 2021-02-08 ENCOUNTER — Encounter: Payer: Self-pay | Admitting: Internal Medicine

## 2021-02-08 VITALS — Ht 65.0 in | Wt 189.0 lb

## 2021-02-08 DIAGNOSIS — Z1211 Encounter for screening for malignant neoplasm of colon: Secondary | ICD-10-CM

## 2021-02-08 MED ORDER — PLENVU 140 G PO SOLR: 1.0000 | ORAL | 0 refills | Status: DC

## 2021-02-08 NOTE — Progress Notes (Signed)
No allergies to soy or egg Pt is not on blood thinners or diet pills Denies issues with sedation/intubation Denies atrial flutter/fib Denies constipation   Emmi instructions given to pt  Pt is aware of Covid safety and care partner requirements.  

## 2021-02-22 ENCOUNTER — Ambulatory Visit (AMBULATORY_SURGERY_CENTER): Payer: 59 | Admitting: Internal Medicine

## 2021-02-22 ENCOUNTER — Other Ambulatory Visit: Payer: Self-pay

## 2021-02-22 ENCOUNTER — Encounter: Payer: Self-pay | Admitting: Internal Medicine

## 2021-02-22 VITALS — BP 117/80 | HR 66 | Temp 98.0°F | Resp 9 | Ht 65.0 in | Wt 189.0 lb

## 2021-02-22 DIAGNOSIS — Z1211 Encounter for screening for malignant neoplasm of colon: Secondary | ICD-10-CM | POA: Diagnosis present

## 2021-02-22 MED ORDER — SODIUM CHLORIDE 0.9 % IV SOLN: 500.0000 mL | Freq: Once | INTRAVENOUS | Status: DC

## 2021-02-22 NOTE — Patient Instructions (Signed)
Your colonoscopy was normal.  You don't need another colonoscopy for 10 years.  YOU HAD AN ENDOSCOPIC PROCEDURE TODAY AT THE [facility]:   Refer to the procedure report that was given to you for any specific questions about what was found during the examination.  If the procedure report does not answer your questions, please call your gastroenterologist to clarify.  If you requested that your care partner not be given the details of your procedure findings, then the procedure report has been included in a sealed envelope for you to review at your convenience later.  YOU SHOULD EXPECT: Some feelings of bloating in the abdomen. Passage of more gas than usual.  Walking can help get rid of the air that was put into your GI tract during the procedure and reduce the bloating. If you had a lower endoscopy (such as a colonoscopy or flexible sigmoidoscopy) you may notice spotting of blood in your stool or on the toilet paper. If you underwent a bowel prep for your procedure, you may not have a normal bowel movement for a few days.  Please Note:  You might notice some irritation and congestion in your nose or some drainage.  This is from the oxygen used during your procedure.  There is no need for concern and it should clear up in a day or so.  SYMPTOMS TO REPORT IMMEDIATELY:   Following lower endoscopy (colonoscopy or flexible sigmoidoscopy):  Excessive amounts of blood in the stool  Significant tenderness or worsening of abdominal pains  Swelling of the abdomen that is new, acute  Fever of 100F or higher  For urgent or emergent issues, a gastroenterologist can be reached at any hour by calling [phone]. Do not use MyChart messaging for urgent concerns.    DIET:  We do recommend a small meal at first, but then you may proceed to your regular diet.  Drink plenty of fluids but you should avoid alcoholic beverages for 24 hours.  ACTIVITY:  You should plan to take it easy for the  rest of today and you should NOT DRIVE or use heavy machinery until tomorrow (because of the sedation medicines used during the test).    FOLLOW UP: Our staff will call the number listed on your records 48-72 hours following your procedure to check on you and address any questions or concerns that you may have regarding the information given to you following your procedure. If we do not reach you, we will leave a message.  We will attempt to reach you two times.  During this call, we will ask if you have developed any symptoms of COVID 19. If you develop any symptoms (ie: fever, flu-like symptoms, shortness of breath, cough etc.) before then, please call [phone].  If you test positive for Covid 19 in the 2 weeks post procedure, please call and report this information to [place].    If any biopsies were taken you will be contacted by phone or by letter within the next 1-3 weeks.  Please call us at [phone] if you have not heard about the biopsies in 3 weeks.    SIGNATURES/CONFIDENTIALITY: You and/or your care partner have signed paperwork which will be entered into your electronic medical record.  These signatures attest to the fact that that the information above on your After Visit Summary has been reviewed and is understood.  Full responsibility of the confidentiality of this discharge information lies with you and/or your care-partner.

## 2021-02-22 NOTE — Progress Notes (Signed)
pt tolerated well. VSS. awake and to recovery. Report given to RN.  

## 2021-02-22 NOTE — Op Note (Signed)
[facility] Patient Name: Lindsey Davis Procedure Date: 02/22/2021 8:58 AM MRN: [id number] Endoscopist: Wilhemina Bonito. Marina Goodell , MD Age: 60 Referring MD:  Date of Birth: 07-13-1961 Gender: Female Account #: [id number] Procedure:                Colonoscopy Indications:              Screening for colorectal malignant neoplasm.                            Negative index exam 2011 Medicines:                Monitored Anesthesia Care Procedure:                Pre-Anesthesia Assessment:                           - Prior to the procedure, a History and Physical                            was performed, and patient medications and                            allergies were reviewed. The patient's tolerance of                            previous anesthesia was also reviewed. The risks                            and benefits of the procedure and the sedation                            options and risks were discussed with the patient.                            All questions were answered, and informed consent                            was obtained. Prior Anticoagulants: The patient has                            taken no previous anticoagulant or antiplatelet                            agents. ASA Grade Assessment: II - A patient with                            mild systemic disease. After reviewing the risks                            and benefits, the patient was deemed in                            satisfactory condition to undergo the procedure.  After obtaining informed consent, the colonoscope                            was passed under direct vision. Throughout the                            procedure, the patient's blood pressure, pulse, and                            oxygen saturations were monitored continuously. The                            Olympus CF-HQ190 [id number]) Colonoscope was                            introduced through the anus and advanced to the  the                            cecum, identified by appendiceal orifice and                            ileocecal valve. The ileocecal valve, appendiceal                            orifice, and rectum were photographed. The quality                            of the bowel preparation was excellent. The                            colonoscopy was performed without difficulty. The                            patient tolerated the procedure well. The bowel                            preparation used was Plenvu via split dose                            instruction. Scope In: 9:12:24 AM Scope Out: 9:25:15 AM Scope Withdrawal Time: 0 hours 10 minutes 24 seconds  Total Procedure Duration: 0 hours 12 minutes 51 seconds  Findings:                 The entire examined colon appeared normal on direct                            and retroflexion views. Complications:            No immediate complications. Estimated blood loss:                            None. Estimated Blood Loss:     Estimated blood loss: none. Impression:               - The entire examined colon is  normal on direct and                            retroflexion views.                           - No specimens collected. Recommendation:           - Repeat colonoscopy in 10 years for screening                            purposes.                           - Patient has a contact number available for                            emergencies. The signs and symptoms of potential                            delayed complications were discussed with the                            patient. Return to normal activities tomorrow.                            Written discharge instructions were provided to the                            patient.                           - Resume previous diet.                           - Continue present medications. Wilhemina Bonito. Marina Goodell, MD 02/22/2021 9:32:26 AM This report has been signed electronically.

## 2021-02-22 NOTE — Progress Notes (Signed)
VS-CW  Pt's states no medical or surgical changes since previsit or office visit.  

## 2021-02-27 ENCOUNTER — Telehealth: Payer: Self-pay | Admitting: *Deleted

## 2021-02-27 NOTE — Telephone Encounter (Signed)
  Follow up Call-  Call back number 02/22/2021  Post procedure Call Back phone  # [phone]  Permission to leave phone message Yes  Some recent data might be hidden     Patient questions:  Do you have a fever, pain , or abdominal swelling? No. Pain Score  0 *  Have you tolerated food without any problems? Yes.    Have you been able to return to your normal activities? Yes.    Do you have any questions about your discharge instructions: Diet   No. Medications  No. Follow up visit  No.  Do you have questions or concerns about your Care? No.  Actions: * If pain score is 4 or above: 1. No action needed, pain <4.Have you developed a fever since your procedure? no  2.   Have you had an respiratory symptoms (SOB or cough) since your procedure? no  3.   Have you tested positive for COVID 19 since your procedure no  4.   Have you had any family members/close contacts diagnosed with the COVID 19 since your procedure?no  If yes to any of these questions please route to Laverna Peace, RN and Karlton Lemon, RN

## 2021-03-08 ENCOUNTER — Other Ambulatory Visit: Payer: Self-pay

## 2021-03-08 ENCOUNTER — Ambulatory Visit
Admission: RE | Admit: 2021-03-08 | Discharge: 2021-03-08 | Disposition: A | Discharge status: Home / Self Care | Payer: 59 | Source: Ambulatory Visit | Attending: Physician Assistant | Admitting: Physician Assistant

## 2021-03-08 DIAGNOSIS — N644 Mastodynia: Secondary | ICD-10-CM

## 2021-03-08 DIAGNOSIS — Z803 Family history of malignant neoplasm of breast: Secondary | ICD-10-CM

## 2021-04-03 ENCOUNTER — Ambulatory Visit (INDEPENDENT_AMBULATORY_CARE_PROVIDER_SITE_OTHER): Payer: 59 | Admitting: Family

## 2021-04-03 ENCOUNTER — Encounter: Payer: Self-pay | Admitting: Family

## 2021-04-03 ENCOUNTER — Other Ambulatory Visit: Payer: Self-pay

## 2021-04-03 VITALS — BP 130/92 | HR 82 | Temp 98.4°F | Ht 65.0 in | Wt 183.0 lb

## 2021-04-03 DIAGNOSIS — W010XXA Fall on same level from slipping, tripping and stumbling without subsequent striking against object, initial encounter: Secondary | ICD-10-CM

## 2021-04-03 DIAGNOSIS — S0012XA Contusion of left eyelid and periocular area, initial encounter: Secondary | ICD-10-CM | POA: Diagnosis not present

## 2021-04-03 DIAGNOSIS — S0990XA Unspecified injury of head, initial encounter: Secondary | ICD-10-CM

## 2021-04-03 NOTE — Patient Instructions (Signed)
Eye Contusion An eye contusion is a deep bruise of the eye or the area around the eye. This is often called a "black eye." A black eye can happen when an injury causes bleeding under the skin. The skin over the bruise may turn blue, purple, green, or yellow. Minor injuries may be painless. Bruises that are very bad may be painful and swollen for a few weeks. A black eye can affect your eyeball and your eyesight. What are the causes?  A hard hit or direct force to your face, nose, or eye.  A head injury that causes the blood under your skin to flow toward your eyelids.  Surgery on your face, such as a face-lift or nose surgery.  Dental work. This includes wisdom tooth removal or dental implant surgery. What are the signs or symptoms?  Pain and swelling around your eye.  A change in the normal color (discoloration) around your eye. ? The area may start out red and then turn blue, purple, green, or yellow.  Blurry vision.  Tearing.  Eyeball redness.   How is this treated? A black eye usually heals on its own in a few days or weeks. If needed, this condition may be treated by:  Icing your eye and taking pain medicine.  Surgery. This may be needed if you have broken bones or an injury to the eyeball. Follow these instructions at home: Managing pain, stiffness, and swelling  If told, put ice on the injured area: ? Put ice in a plastic bag. ? Place a towel between your skin and the bag. ? Leave the ice on for 20 minutes, 2-3 times a day.   General instructions  Sleep with your head raised (elevated). You may do this by putting an extra pillow under your head.  Return to your normal activities as told by your doctor. Ask your doctor what activities are safe for you.  Take over-the-counter and prescription medicines only as told by your doctor.  Keep all follow-up visits as told by your doctor. This is important. Contact a doctor if:  Your symptoms do not get better after  several days.  Medicine does not help your swelling or pain. Get help right away if:  You lose your sight.  You are seeing double.  Your eye suddenly turns red.  The black part of your eye (pupil) is an odd shape or size.  You have very bad pain.  You have a very bad headache.  You feel sick to your stomach (nauseous).  You throw up (vomit).  You feel dizzy or sleepy, or you feel like you will pass out (faint).  You pass out.  You have a lot of clear fluid or blood coming from your eye or nose. Summary  A black eye can happen when an injury causes bleeding under the skin.  The skin around the eye may look blue, purple, green, or yellow.  A black eye often heals on its own in a few days or weeks. If needed, it may be treated with ice and pain medicines.  Surgery may be needed if you have broken bones or an injury to the eyeball. This information is not intended to replace advice given to you by your health care provider. Make sure you discuss any questions you have with your health care provider. Document Revised: 06/30/2018 Document Reviewed: 06/30/2018 Elsevier Patient Education  2021 [employer].

## 2021-04-03 NOTE — Progress Notes (Signed)
Acute Office Visit  Subjective:    Patient ID: Lindsey Davis, female    DOB: 10-02-1961, 60 y.o.   MRN: [id number]  Chief Complaint  Patient presents with  . Fall    Pt says that she fell running to the bathroom. She hit the top left corner of her head.   . Facial Swelling    Left eye bruised from fall. She denies headache or dizziness.    HPI Patient is in today for 60 year old female presents today after a fall that she sustained on Saturday, 2 days ago, as she ran to the bathroom on the lower level of her home.  Patient reports that she slipped on the wet floor and hit the left side of her head on a door.  Initially, she noticed swelling and pain to the left frontal area of her head.  She denied any dizziness, headache, nausea or vomiting, blurred vision or double vision.  However, today became alarmed because she has bruising around her left eye.   Past Medical History:  Diagnosis Date  . Allergy   . Anemia   . Asthma   . Fibrocystic breast   . Iron deficiency anemia 09/20/2010   Resolved post menopause.      Past Surgical History:  Procedure Laterality Date  . COLONOSCOPY    . WRIST SURGERY      Family History  Problem Relation Age of Onset  . Breast cancer Mother        43  . Urinary tract infection Mother        chronic  . Heart attack Father        5  . Breast cancer Sister        early 75s  . Cancer Paternal Grandmother   . Colon cancer Neg Hx   . Colon polyps Neg Hx   . Esophageal cancer Neg Hx   . Rectal cancer Neg Hx   . Stomach cancer Neg Hx     Social History   Socioeconomic History  . Marital status: Married    Spouse name: Not on file  . Number of children: Not on file  . Years of education: Not on file  . Highest education level: Not on file  Occupational History  . Not on file  Tobacco Use  . Smoking status: Former Smoker    Packs/day: 1.00    Years: 10.00    Pack years: 10.00    Types: Cigarettes    Quit date: 12/16/1998     Years since quitting: 22.3  . Smokeless tobacco: Never Used  Vaping Use  . Vaping Use: Never used  Substance and Sexual Activity  . Alcohol use: No    Alcohol/week: 0.0 standard drinks  . Drug use: No  . Sexual activity: Not on file  Other Topics Concern  . Not on file  Social History Narrative   Married 1990. 3 kids (1 set of twins 88 in 2017, oldest 50- started nursing school).       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      Hobbies: enjoys exercise   Social Determinants of Health   Financial Resource Strain: Not on file  Food Insecurity: Not on file  Transportation Needs: Not on file  Physical Activity: Not on file  Stress: Not on file  Social Connections:  Not on file  Intimate Partner Violence: Not on file    Outpatient Medications Prior to Visit  Medication Sig Dispense Refill  . acetaminophen (TYLENOL) 500 MG tablet Take 500 mg by mouth every 6 (six) hours as needed.    Marland Kitchen albuterol (VENTOLIN HFA) 108 (90 Base) MCG/ACT inhaler Inhale 2 puffs into the lungs every 6 (six) hours as needed for wheezing or shortness of breath (cough, shortness of breath or wheezing.). 18 g 5  . amphetamine-dextroamphetamine (ADDERALL XR) 30 MG 24 hr capsule Take 30 mg by mouth daily.    . ARIPiprazole (ABILIFY) 2 MG tablet Take 2 mg by mouth daily.    . Cholecalciferol (VITAMIN D3 PO) Take by mouth.    . ferrous sulfate 324 MG TBEC Take 324 mg by mouth.    . levothyroxine (SYNTHROID) 50 MCG tablet Take 50 mcg by mouth daily.    . Melatonin 10 MG TABS Take by mouth.    . Multiple Vitamin (MULTIVITAMIN) capsule Take 1 capsule by mouth daily.    . traZODone (DESYREL) 50 MG tablet TAKE TWO TO THREE AT BEDTIME    . vortioxetine HBr (TRINTELLIX) 20 MG TABS tablet Take 20 mg by mouth daily.     No facility-administered medications  prior to visit.    No Known Allergies  Review of Systems  Constitutional: Negative.  Negative for fatigue.  Eyes: Negative for photophobia, redness and visual disturbance.       Bruising noted around the left eye  Respiratory: Negative.   Endocrine: Negative.   Musculoskeletal: Negative.   Skin: Negative.   Neurological: Positive for headaches. Negative for dizziness, weakness and numbness.       Mild headache  Hematological: Negative.   Psychiatric/Behavioral: Negative.   All other systems reviewed and are negative.      Objective:    Physical Exam Constitutional:      Appearance: Normal appearance. She is normal weight.  HENT:     Head:   Eyes:     General: Lids are normal.        Right eye: No foreign body or discharge.        Left eye: No foreign body or discharge.     Extraocular Movements: Extraocular movements intact.     Conjunctiva/sclera: Conjunctivae normal.     Pupils: Pupils are equal, round, and reactive to light.     Comments: Dark blue and yellowish colored bruising noted around the left orbital area with mild swelling.  Cardiovascular:     Rate and Rhythm: Normal rate and regular rhythm.     Pulses: Normal pulses.     Heart sounds: Normal heart sounds.  Pulmonary:     Effort: Pulmonary effort is normal.     Breath sounds: Normal breath sounds.  Abdominal:     General: Abdomen is flat.  Musculoskeletal:        General: Normal range of motion.     Cervical back: Normal range of motion and neck supple.  Skin:    General: Skin is dry.  Neurological:     Mental Status: She is alert.  Psychiatric:        Mood and Affect: Mood normal.        Behavior: Behavior normal.     BP (!) 130/92   Pulse 82   Temp 98.4 F (36.9 C) (Temporal)   Ht 5\' 5"  (1.651 m)   Wt 183 lb (83 kg)   LMP 03/31/2011   SpO2 98%  BMI 30.45 kg/m  Wt Readings from Last 3 Encounters:  04/03/21 183 lb (83 kg)  02/22/21 189 lb (85.7 kg)  02/08/21 189 lb (85.7 kg)       There are no preventive care reminders to display for this patient.      Assessment & Plan:   Problem List Items Addressed This Visit   None   Visit Diagnoses    Traumatic injury of head, initial encounter    -  Primary   Fall from slipping on slippery surface, initial encounter       Black eye of left side, initial encounter           No orders of the defined types were placed in this encounter.   Discussed the likelihood of an internal head bleed bleed being very minimal at this point considering she is post 24 hours from her head injury.  Bruising results of the head injury and will resolve over the next week.  Call the office with any questions or concerns or if any symptoms of nausea, vomiting, blurred vision or double vision occur. Eulis Foster, FNP

## 2021-05-02 ENCOUNTER — Ambulatory Visit: Payer: 59 | Attending: Critical Care Medicine

## 2021-05-02 ENCOUNTER — Other Ambulatory Visit: Payer: 59

## 2021-05-02 DIAGNOSIS — Z20822 Contact with and (suspected) exposure to covid-19: Secondary | ICD-10-CM

## 2021-05-03 LAB — NOVEL CORONAVIRUS, NAA: SARS-CoV-2, NAA: NOT DETECTED

## 2021-05-03 LAB — SARS-COV-2, NAA 2 DAY TAT

## 2021-09-04 ENCOUNTER — Encounter: Payer: Self-pay | Admitting: Family Medicine

## 2021-09-04 ENCOUNTER — Telehealth (INDEPENDENT_AMBULATORY_CARE_PROVIDER_SITE_OTHER): Payer: 59 | Admitting: Family Medicine

## 2021-09-04 VITALS — Temp 98.0°F | Ht 65.0 in | Wt 176.0 lb

## 2021-09-04 DIAGNOSIS — R059 Cough, unspecified: Secondary | ICD-10-CM | POA: Diagnosis not present

## 2021-09-04 DIAGNOSIS — J4520 Mild intermittent asthma, uncomplicated: Secondary | ICD-10-CM | POA: Diagnosis not present

## 2021-09-04 DIAGNOSIS — U071 COVID-19: Secondary | ICD-10-CM | POA: Diagnosis not present

## 2021-09-04 DIAGNOSIS — F3342 Major depressive disorder, recurrent, in full remission: Secondary | ICD-10-CM

## 2021-09-04 MED ORDER — PROMETHAZINE-DM 6.25-15 MG/5ML PO SYRP: 5.0000 mL | ORAL_SOLUTION | Freq: Four times a day (QID) | ORAL | 0 refills | Status: DC | PRN

## 2021-09-04 MED ORDER — BENZONATATE 200 MG PO CAPS: 200.0000 mg | ORAL_CAPSULE | Freq: Two times a day (BID) | ORAL | 0 refills | Status: DC | PRN

## 2021-09-04 MED ORDER — MOLNUPIRAVIR 200 MG PO CAPS: 4.0000 | ORAL_CAPSULE | Freq: Two times a day (BID) | ORAL | 0 refills | Status: AC

## 2021-09-04 NOTE — Progress Notes (Signed)
   Lindsey Davis is a 60 y.o. female who presents today for a virtual office visit.  Assessment/Plan:  New/Acute Problems: COVID/Cough Symptoms relatively mild at this point however is at increased risk for complication due to underlying history of asthma and obesity.  Discussed starting antiviral therapy.  She agreed to this.  We will start molnupiravir to limit medication interactions.  We will also treat her cough with Tessalon and promethazine-dextromethorphan cough syrup.  Discussed reasons to return to care or seek emergent care.  Follow-up as needed.  Chronic Problems Addressed Today: Asthma No signs of acute flare.  She can use albuterol as needed.  Do not think she needs steroids at this point  Depression Symptoms are well controlled however she is on several medications that potentially could interact with paxlovid including Trintellix, trazodone, and Abilify.  We will start menu.  Here as above.    Subjective:  HPI:  Patient here with covid. First symptom 4 days ago. Had known exposure 7 days ago. Most predominant symptom at this point is cough and sore throat. Also some nasal congestion and headache. Some myalgias. No fever. Tried OTC meds which have been minimally effective. No shortness of breath       Objective/Observations  Physical Exam: Gen: NAD, resting comfortably Pulm: Normal work of breathing Neuro: Grossly normal, moves all extremities Psych: Normal affect and thought content  Virtual Visit via Video   I connected with Lindsey Davis on 09/04/21 at  8:20 AM EDT by a video enabled telemedicine application and verified that I am speaking with the correct person using two identifiers. The limitations of evaluation and management by telemedicine and the availability of in person appointments were discussed. The patient expressed understanding and agreed to proceed.   Patient location: Home Provider location: [facility] Persons  participating in the virtual visit: Myself and Patient     Katina Degree. Jimmey Ralph, MD 09/04/2021 8:19 AM

## 2021-09-27 ENCOUNTER — Other Ambulatory Visit: Payer: Self-pay | Admitting: Family Medicine

## 2021-09-27 DIAGNOSIS — Z1231 Encounter for screening mammogram for malignant neoplasm of breast: Secondary | ICD-10-CM

## 2021-10-25 ENCOUNTER — Ambulatory Visit
Admission: RE | Admit: 2021-10-25 | Discharge: 2021-10-25 | Disposition: A | Discharge status: Home / Self Care | Payer: 59 | Source: Ambulatory Visit | Attending: Family Medicine | Admitting: Family Medicine

## 2021-10-25 ENCOUNTER — Other Ambulatory Visit: Payer: Self-pay

## 2021-10-25 DIAGNOSIS — Z1231 Encounter for screening mammogram for malignant neoplasm of breast: Secondary | ICD-10-CM

## 2021-11-16 NOTE — Progress Notes (Signed)
Phone [phone]   Subjective:  Patient presents today for their annual physical. Chief complaint-noted.   See problem oriented charting- ROS- full  review of systems was completed and ne and COVID gative Per full ROS sheet completed by patient   The following were reviewed and entered/updated in epic: Past Medical History:  Diagnosis Date   Allergy    Anemia    Asthma    Fibrocystic breast    Iron deficiency anemia 09/20/2010   Resolved post menopause.     Patient Active Problem List   Diagnosis Date Noted   Hypothyroidism 01/17/2016    Priority: Medium    Obesity 10/21/2014    Priority: Medium    Depression 03/28/2008    Priority: Medium    Attention deficit disorder 03/28/2008    Priority: Medium    Asthma 02/09/2008    Priority: Medium    Allergic rhinitis 02/09/2008    Priority: Low   Past Surgical History:  Procedure Laterality Date   COLONOSCOPY     WRIST SURGERY      Family History  Problem Relation Age of Onset   Breast cancer Mother        36   Urinary tract infection Mother        chronic   Heart attack Father        8   Breast cancer Sister        early 55s   Cancer Paternal Grandmother    Colon cancer Neg Hx    Colon polyps Neg Hx    Esophageal cancer Neg Hx    Rectal cancer Neg Hx    Stomach cancer Neg Hx     Medications- reviewed and updated Current Outpatient Medications  Medication Sig Dispense Refill   acetaminophen (TYLENOL) 500 MG tablet Take 500 mg by mouth every 6 (six) hours as needed.     amphetamine-dextroamphetamine (ADDERALL XR) 30 MG 24 hr capsule Take 30 mg by mouth daily.     ARIPiprazole (ABILIFY) 2 MG tablet Take 2 mg by mouth daily.     benzonatate (TESSALON) 200 MG capsule Take 1 capsule (200 mg total) by mouth 2 (two) times daily as needed for cough. 20 capsule 0   Cholecalciferol (VITAMIN D3 PO) Take by mouth.     ferrous sulfate 324 MG TBEC Take 324 mg by mouth.     levothyroxine (SYNTHROID) 50 MCG tablet  Take 50 mcg by mouth daily.     Melatonin 10 MG TABS Take by mouth.     Multiple Vitamin (MULTIVITAMIN) capsule Take 1 capsule by mouth daily.     traZODone (DESYREL) 50 MG tablet TAKE TWO TO THREE AT BEDTIME     vortioxetine HBr (TRINTELLIX) 20 MG TABS tablet Take 20 mg by mouth daily.     albuterol (VENTOLIN HFA) 108 (90 Base) MCG/ACT inhaler Inhale 2 puffs into the lungs every 6 (six) hours as needed for wheezing or shortness of breath (cough, shortness of breath or wheezing.). 18 g 5   promethazine-dextromethorphan (PROMETHAZINE-DM) 6.25-15 MG/5ML syrup Take 5 mLs by mouth 4 (four) times daily as needed for cough. (Patient not taking: Reported on 11/23/2021) 118 mL 0   No current facility-administered medications for this visit.    Allergies-reviewed and updated No Known Allergies  Social History   Social History Narrative   Married 1990. 3 kids (1 set of twins 18 in 2017, oldest 43- started nursing school).       Finished 2013 with doctorate in  nursing, specialty in leadership.    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      Hobbies: enjoys exercise   Objective  Objective:  BP 122/78 (BP Location: Left Arm)   Pulse 84   Temp 97.6 F (36.4 C) (Temporal)   Ht 5\' 5"  (1.651 m)   Wt 185 lb 9.6 oz (84.2 kg)   LMP 03/31/2011   SpO2 99%   BMI 30.89 kg/m  Gen: NAD, resting comfortably HEENT: Mucous membranes are moist. Oropharynx normal Neck: no thyromegaly CV: RRR no murmurs rubs or gallops Lungs: CTAB no crackles, wheeze, rhonchi Abdomen: soft/nontender/nondistended/normal bowel sounds. No rebound or guarding.  Ext: no edema Skin: warm, dry Neuro: grossly normal, moves all extremities, PERRLA   Assessment and Plan   60 y.o. female presenting for annual physical.  Health Maintenance counseling: 1. Anticipatory guidance: Patient counseled regarding regular dental exams -q6 months,  eye exams -,  avoiding smoking and second hand smoke , limiting alcohol to 1 beverage per day . No illicit drugs.   2. Risk factor reduction:  Advised patient of need for regular exercise and diet rich and fruits and vegetables to reduce risk of heart attack and stroke. Exercise- some variation- has been harder since dad died because has to be there for her mom.  Has run a 5k twice this year though- getting in top 6 in division! Diet/weight management-weight up slightly from last year about 4 pounds- she had gotten down lower but gained over thanksgiving. Shed like to get to 160 in long run and maintain . Has done a phenomenal job down from peak of 236 Wt Readings from Last 3 Encounters:  11/23/21 185 lb 9.6 oz (84.2 kg)  09/04/21 176 lb (79.8 kg)  04/03/21 183 lb (83 kg)  3. Immunizations/screenings/ancillary studies DISCUSSED:  -Flu vaccine (last one 11/21) - already had - will get 12/21 the date -COVID booster vaccine #5 - has not had bivalent booster- had covid in september -Prevnar-20 vaccine #1 - will wait for age 62 Immunization History  Administered Date(s) Administered   Influenza Whole 09/10/2008   Influenza,inj,Quad PF,6+ Mos 10/20/2017, 11/26/2018, 08/17/2019, 09/29/2021   Influenza-Unspecified 08/16/2014, 11/01/2015, 10/16/2020   PFIZER(Purple Top)SARS-COV-2 Vaccination 12/27/2019, 01/17/2020, 11/08/2020, 06/30/2021   Tdap 03/25/2013   Zoster Recombinat (Shingrix) 08/17/2019, 11/17/2019  4. Cervical cancer screening-pap smear 08/17/19 with 3 year repeat planned was normal. Planned 2023 with our office. No discharge or blood noted 5. Breast cancer screening-  breast exam- declined today-and mammogram 10/25/21 with 1 year repeat planned. Also does self exams 6. Colon cancer screening - colonoscopy 02/22/21 with 10 year repeat planned-   7. Skin cancer screening-  no dermatologist. advised regular sunscreen use. Denies worrisome, changing, or new skin lesions.  8. Birth control/STD check-  postmenopausal and monogamous  9. Osteoporosis screening at 65-as postmenopausal offered bone density-former smoker increases risk but likely quit over 20 years ago -Former smoker- 10 pack years but quit in 2000s. consider UA next isit- just urinated  Status of chronic or acute concerns   #caregiver burden- caring for mom (hospice out weekly for her). Lost dad in June unfortunately. Husband July able to help some thankfully.    # Asthma S: Maintenance Medication: none As needed medication: albuterol. Patient is using this 0x  per week.  A/P: well controled- continue to monitor   #hypothyroidism S: compliant On thyroid medication- Synthroid 50 Mcg daily Lab  Results  Component Value Date   TSH 1.19 11/17/2020   A/P: hopefully stable- update  today. Continue current meds for now   #hyperlipidemia S: Medication:none  Lab Results  Component Value Date   CHOL 237 (H) 11/17/2020   HDL 71 11/17/2020   LDLCALC 149 (H) 11/17/2020   TRIG 73 11/17/2020   CHOLHDL 3.3 11/17/2020   A/P: 27-OZDG ASCVD risk only 3.2% as long as this remains below 5% unlikely to consider cholesterol medicine or ct cardiac  # Depression- Dr. Evelene Croon manages #ADD- controlled with adderrall 30 mg XR S: Medication: trintellix 20 mg daily, Abilify 2Mg  daily, Trazadone 50Mg  at bedtime A/P: Patient reports depression is well controlled with psychiatry as well as ADD-continue current medication  Recommended follow up: Return in about 1 year (around 11/23/2022) for physical or sooner if needed.  Lab/Order associations: NOT fasting   ICD-10-CM   1. Preventative health care  Z00.00 Comprehensive metabolic panel    CBC with Differential/Platelet    Lipid panel    TSH    Comprehensive metabolic panel    CBC with Differential/Platelet    Lipid panel    TSH    2. Recurrent major depressive disorder, in full remission (HCC)  F33.42     3. Hypothyroidism, unspecified type  E03.9 TSH    TSH    4. Mild intermittent  asthma without complication  J45.20     5. Attention deficit disorder, unspecified hyperactivity presence  F98.8     6. Hyperlipidemia, unspecified hyperlipidemia type  E78.5 Comprehensive metabolic panel    CBC with Differential/Platelet    Lipid panel    Comprehensive metabolic panel    CBC with Differential/Platelet    Lipid panel    7. Postmenopausal  Z78.0 DG Bone Density      Meds ordered this encounter  Medications   albuterol (VENTOLIN HFA) 108 (90 Base) MCG/ACT inhaler    Sig: Inhale 2 puffs into the lungs every 6 (six) hours as needed for wheezing or shortness of breath (cough, shortness of breath or wheezing.).    Dispense:  18 g    Refill:  5   I,Jada Bradford,acting as a scribe for , MD.,have documented all relevant documentation on the behalf of 14/08/2022, MD,as directed by  Tana Conch, MD while in the presence of Tana Conch, MD.   I, Tana Conch, MD, have reviewed all documentation for this visit. The documentation on 11/23/21 for the exam, diagnosis, procedures, and orders are all accurate and complete.   Return precautions advised.  Tana Conch, MD

## 2021-11-23 ENCOUNTER — Other Ambulatory Visit: Payer: Self-pay

## 2021-11-23 ENCOUNTER — Encounter: Payer: Self-pay | Admitting: Family Medicine

## 2021-11-23 ENCOUNTER — Ambulatory Visit (INDEPENDENT_AMBULATORY_CARE_PROVIDER_SITE_OTHER): Payer: 59 | Admitting: Family Medicine

## 2021-11-23 VITALS — BP 122/78 | HR 84 | Temp 97.6°F | Ht 65.0 in | Wt 185.6 lb

## 2021-11-23 DIAGNOSIS — Z780 Asymptomatic menopausal state: Secondary | ICD-10-CM

## 2021-11-23 DIAGNOSIS — Z0000 Encounter for general adult medical examination without abnormal findings: Secondary | ICD-10-CM

## 2021-11-23 DIAGNOSIS — E039 Hypothyroidism, unspecified: Secondary | ICD-10-CM | POA: Diagnosis not present

## 2021-11-23 DIAGNOSIS — E785 Hyperlipidemia, unspecified: Secondary | ICD-10-CM

## 2021-11-23 DIAGNOSIS — F988 Other specified behavioral and emotional disorders with onset usually occurring in childhood and adolescence: Secondary | ICD-10-CM

## 2021-11-23 DIAGNOSIS — F3342 Major depressive disorder, recurrent, in full remission: Secondary | ICD-10-CM

## 2021-11-23 DIAGNOSIS — J4520 Mild intermittent asthma, uncomplicated: Secondary | ICD-10-CM | POA: Diagnosis not present

## 2021-11-23 MED ORDER — ALBUTEROL SULFATE HFA 108 (90 BASE) MCG/ACT IN AERS: 2.0000 | INHALATION_SPRAY | Freq: Four times a day (QID) | RESPIRATORY_TRACT | 5 refills | Status: DC | PRN

## 2021-11-23 NOTE — Patient Instructions (Addendum)
Health Maintenance Due  Topic Date Due   Pneumococcal Vaccine 69-61 Years old (1 - PCV)- Team please postpone till age 86.   Never done   INFLUENZA VACCINE - Please send [place] the date of your flu shot.  07/16/2021   COVID-19 Vaccine (5 - Booster for [employer] series)- You have to wait within 3 months to get your bivalent booster shot at your local pharmacy. When you receive, please send [place] the date.  08/25/2021   Schedule your bone density test at check out desk.  - located [address] across the street from [place] - in the basement - you DO NEED an appointment for the bone density tests.   Thank you for doing labs! If you have mychart- we will send your results within 3 business days of [place] receiving them.  If you do not have mychart- we will call you about results within 5 business days of [place] receiving them.  *please also note that you will see labs on mychart as soon as they post. I will later go in and write notes on them- will say "notes from Dr. Durene Cal"   Recommended follow up: Return in about 1 year (around 11/23/2022) for physical or sooner if needed.  Happy Holidays!

## 2021-11-24 LAB — COMPREHENSIVE METABOLIC PANEL
AG Ratio: 1.8 (calc) (ref 1.0–2.5)
ALT: 14 U/L (ref 6–29)
AST: 15 U/L (ref 10–35)
Albumin: 4.2 g/dL (ref 3.6–5.1)
Alkaline phosphatase (APISO): 42 U/L (ref 37–153)
BUN: 21 mg/dL (ref 7–25)
CO2: 21 mmol/L (ref 20–32)
Calcium: 8.9 mg/dL (ref 8.6–10.4)
Chloride: 104 mmol/L (ref 98–110)
Creat: 0.62 mg/dL (ref 0.50–1.05)
Globulin: 2.3 g/dL (calc) (ref 1.9–3.7)
Glucose, Bld: 86 mg/dL (ref 65–99)
Potassium: 4.4 mmol/L (ref 3.5–5.3)
Sodium: 137 mmol/L (ref 135–146)
Total Bilirubin: 0.4 mg/dL (ref 0.2–1.2)
Total Protein: 6.5 g/dL (ref 6.1–8.1)

## 2021-11-24 LAB — CBC WITH DIFFERENTIAL/PLATELET
Absolute Monocytes: 291 cells/uL (ref 200–950)
Basophils Absolute: 37 cells/uL (ref 0–200)
Basophils Relative: 0.6 %
Eosinophils Absolute: 37 cells/uL (ref 15–500)
Eosinophils Relative: 0.6 %
HCT: 39.8 % (ref 35.0–45.0)
Hemoglobin: 13.4 g/dL (ref 11.7–15.5)
Lymphs Abs: 1618 cells/uL (ref 850–3900)
MCH: 30.2 pg (ref 27.0–33.0)
MCHC: 33.7 g/dL (ref 32.0–36.0)
MCV: 89.8 fL (ref 80.0–100.0)
MPV: 10.5 fL (ref 7.5–12.5)
Monocytes Relative: 4.7 %
Neutro Abs: 4216 cells/uL (ref 1500–7800)
Neutrophils Relative %: 68 %
Platelets: 217 10*3/uL (ref 140–400)
RBC: 4.43 10*6/uL (ref 3.80–5.10)
RDW: 12.7 % (ref 11.0–15.0)
Total Lymphocyte: 26.1 %
WBC: 6.2 10*3/uL (ref 3.8–10.8)

## 2021-11-24 LAB — LIPID PANEL
Cholesterol: 217 mg/dL — ABNORMAL HIGH (ref <200)
HDL: 59 mg/dL (ref >=50)
LDL Cholesterol (Calc): 134 mg/dL (calc) — ABNORMAL HIGH
Non-HDL Cholesterol (Calc): 158 mg/dL (calc) — ABNORMAL HIGH (ref <130)
Total CHOL/HDL Ratio: 3.7 (calc) (ref <5.0)
Triglycerides: 125 mg/dL (ref <150)

## 2021-11-24 LAB — TSH: TSH: 2.34 mIU/L (ref 0.40–4.50)

## 2021-12-13 ENCOUNTER — Ambulatory Visit (INDEPENDENT_AMBULATORY_CARE_PROVIDER_SITE_OTHER)
Admission: RE | Admit: 2021-12-13 | Discharge: 2021-12-13 | Disposition: A | Discharge status: Home / Self Care | Payer: 59 | Source: Ambulatory Visit | Attending: Family Medicine | Admitting: Family Medicine

## 2021-12-13 DIAGNOSIS — Z780 Asymptomatic menopausal state: Secondary | ICD-10-CM

## 2021-12-18 ENCOUNTER — Encounter: Payer: Self-pay | Admitting: Family Medicine

## 2021-12-18 DIAGNOSIS — M810 Age-related osteoporosis without current pathological fracture: Secondary | ICD-10-CM | POA: Insufficient documentation

## 2021-12-22 ENCOUNTER — Encounter: Payer: Self-pay | Admitting: Family Medicine

## 2022-01-15 ENCOUNTER — Ambulatory Visit: Payer: 59 | Admitting: Family Medicine

## 2022-01-16 NOTE — Progress Notes (Signed)
Phone [phone] In person visit   Subjective:   Lindsey Davis is a 61 y.o. year old very pleasant female patient who presents for/with See problem oriented charting Chief Complaint  Patient presents with   Follow-up    Discuss labs and further Osteoporosis workup.    This visit occurred during the SARS-CoV-2 public health emergency.  Safety protocols were in place, including screening questions prior to the visit, additional usage of staff PPE, and extensive cleaning of exam room while observing appropriate contact time as indicated for disinfecting solutions.   Past Medical History-  Patient Active Problem List   Diagnosis Date Noted   Osteoporosis 12/18/2021    Priority: Medium    Hypothyroidism 01/17/2016    Priority: Medium    Obesity 10/21/2014    Priority: Medium    Depression 03/28/2008    Priority: Medium    Attention deficit disorder 03/28/2008    Priority: Medium    Asthma 02/09/2008    Priority: Medium    Allergic rhinitis 02/09/2008    Priority: Low    Medications- reviewed and updated Current Outpatient Medications  Medication Sig Dispense Refill   acetaminophen (TYLENOL) 500 MG tablet Take 500 mg by mouth every 6 (six) hours as needed.     albuterol (VENTOLIN HFA) 108 (90 Base) MCG/ACT inhaler Inhale 2 puffs into the lungs every 6 (six) hours as needed for wheezing or shortness of breath (cough, shortness of breath or wheezing.). 18 g 5   amphetamine-dextroamphetamine (ADDERALL XR) 30 MG 24 hr capsule Take 30 mg by mouth daily.     ARIPiprazole (ABILIFY) 2 MG tablet Take 2 mg by mouth daily.     calcium carbonate (OS-CAL - DOSED IN MG OF ELEMENTAL CALCIUM) 1250 (500 Ca) MG tablet Take 1 tablet by mouth.     Cholecalciferol (VITAMIN D3 PO) Take by mouth.     ferrous sulfate 324 MG TBEC Take 324 mg by mouth.     levothyroxine (SYNTHROID) 50 MCG tablet Take 50 mcg by mouth daily.     Melatonin 10 MG TABS Take by mouth.     Multiple Vitamin  (MULTIVITAMIN) capsule Take 1 capsule by mouth daily.     traZODone (DESYREL) 50 MG tablet TAKE TWO TO THREE AT BEDTIME     vortioxetine HBr (TRINTELLIX) 20 MG TABS tablet Take 20 mg by mouth daily.     No current facility-administered medications for this visit.     Objective:  BP 128/90    Pulse 77    Temp 97.6 F (36.4 C)    Ht 5\' 5"  (1.651 m)    Wt 181 lb 12.8 oz (82.5 kg)    LMP 03/31/2011    SpO2 98%    BMI 30.25 kg/m      Assessment and Plan    #Social update-unfortunately patient lost her mother in January 2023 and father in June 2022-she is handling this that she can  # Osteoporosis S: Last DEXA: 12/17/21 with lumbar spine -3.6 with femur DEXA -1.9 and -2.0 - We are not aware his mother had osteoporosis-had declined testing late in life as would not be willing to add additional medication  Medication (bisphosphonate or prolia):  fosamax recommended 12/18/21 by phone versus in office discussion versus endocrinology referral-she wanted to come in today to discuss options  Calcium: 1200mg  (through diet ok) recommended  Vitamin D: 1000 units a day recommended  Lab Results  Component Value Date   VD25OH 45 11/17/2020   A/P:  61 year old female with osteoporosis.  We discussed taking calcium, vitamin D, continuing weightbearing exercise with goal at least under 50 minutes a week but she is doing a great job of this-has been running. - Discussed Fosamax and potential side effects as well as potentially Reclast or Prolia.  She is concerned about side effects and taking medication long-term-she leans more towards calcium/vitamin D/weightbearing exercise and recheck in 2 years without additional medication - After discussion today she ultimately wanted to also discuss with endocrinology which I am in full support of   #Elevated blood pressure reading-blood pressure slightly elevated today diastolic but patient with some anxiety about osteoporosis discussion-recheck next  visit Recommended follow up: No follow-ups on file.   Lab/Order associations:   ICD-10-CM   1. Osteoporosis, unspecified osteoporosis type, unspecified pathological fracture presence  M81.0 Ambulatory referral to Endocrinology      No orders of the defined types were placed in this encounter.   Time Spent: 27 minutes of total time (11:35 PM- 12:02 PM) was spent on the date of the encounter performing the following actions: chart review prior to seeing the patient, obtaining history, performing a medically necessary exam, counseling on the treatment plan, placing orders, and documenting in our EHR.    I,Jada Bradford,acting as a scribe for Tana Conch, MD.,have documented all relevant documentation on the behalf of Tana Conch, MD,as directed by  Tana Conch, MD while in the presence of Tana Conch, MD.  I, Tana Conch, MD, have reviewed all documentation for this visit. The documentation on 01/17/22 for the exam, diagnosis, procedures, and orders are all accurate and complete.  Return precautions advised.  Tana Conch, MD

## 2022-01-17 ENCOUNTER — Other Ambulatory Visit: Payer: Self-pay

## 2022-01-17 ENCOUNTER — Ambulatory Visit: Payer: 59 | Admitting: Family Medicine

## 2022-01-17 ENCOUNTER — Encounter: Payer: Self-pay | Admitting: Family Medicine

## 2022-01-17 VITALS — BP 128/90 | HR 77 | Temp 97.6°F | Ht 65.0 in | Wt 181.8 lb

## 2022-01-17 DIAGNOSIS — E039 Hypothyroidism, unspecified: Secondary | ICD-10-CM

## 2022-01-17 DIAGNOSIS — F3342 Major depressive disorder, recurrent, in full remission: Secondary | ICD-10-CM

## 2022-01-17 DIAGNOSIS — M810 Age-related osteoporosis without current pathological fracture: Secondary | ICD-10-CM | POA: Diagnosis not present

## 2022-01-17 DIAGNOSIS — J4520 Mild intermittent asthma, uncomplicated: Secondary | ICD-10-CM

## 2022-01-17 DIAGNOSIS — E785 Hyperlipidemia, unspecified: Secondary | ICD-10-CM

## 2022-01-17 NOTE — Patient Instructions (Addendum)
Target 1200mg  calcium per day. Team please give her calcium in diet handout.   Target 1000 units vitamin D per day  150 minutes a week of weight bearing exercise  Hold off on medicine like fosamax for now until we get further opinion.   We will call you within two weeks about your referral to endocrinology Dr. most likely. If you do not hear within 2 weeks, give Lindsey Davis a call.   Recommended follow up: schedule physical 1 year out from last physical.

## 2022-02-11 ENCOUNTER — Other Ambulatory Visit (HOSPITAL_COMMUNITY): Payer: Self-pay

## 2022-02-11 MED ORDER — AMPHETAMINE-DEXTROAMPHET ER 30 MG PO CP24
ORAL_CAPSULE | ORAL | 0 refills | Status: DC
  Filled 2022-02-11 – 2022-02-12 (×2): qty 30, 15d supply, fill #0

## 2022-02-12 ENCOUNTER — Other Ambulatory Visit (HOSPITAL_COMMUNITY): Payer: Self-pay

## 2022-02-12 MED ORDER — AMPHETAMINE-DEXTROAMPHETAMINE 20 MG PO TABS
ORAL_TABLET | ORAL | 0 refills | Status: DC
  Filled 2022-02-12: qty 45, 30d supply, fill #0

## 2022-02-12 MED ORDER — AMPHETAMINE-DEXTROAMPHETAMINE 30 MG PO TABS
ORAL_TABLET | ORAL | 0 refills | Status: DC
  Filled 2022-02-12: qty 30, 25d supply, fill #0

## 2022-03-04 ENCOUNTER — Ambulatory Visit (INDEPENDENT_AMBULATORY_CARE_PROVIDER_SITE_OTHER): Payer: 59 | Admitting: Internal Medicine

## 2022-03-04 ENCOUNTER — Encounter: Payer: Self-pay | Admitting: Internal Medicine

## 2022-03-04 ENCOUNTER — Other Ambulatory Visit: Payer: Self-pay

## 2022-03-04 VITALS — BP 112/74 | HR 64 | Ht 64.5 in | Wt 181.0 lb

## 2022-03-04 DIAGNOSIS — M810 Age-related osteoporosis without current pathological fracture: Secondary | ICD-10-CM | POA: Diagnosis not present

## 2022-03-04 LAB — BASIC METABOLIC PANEL
BUN: 23 mg/dL (ref 6–23)
CO2: 29 mEq/L (ref 19–32)
Calcium: 9.2 mg/dL (ref 8.4–10.5)
Chloride: 102 mEq/L (ref 96–112)
Creatinine, Ser: 0.65 mg/dL (ref 0.40–1.20)
GFR: 95.02 mL/min (ref >60.00)
Glucose, Bld: 90 mg/dL (ref 70–99)
Potassium: 4.5 mEq/L (ref 3.5–5.1)
Sodium: 137 mEq/L (ref 135–145)

## 2022-03-04 LAB — T4, FREE: Free T4: 1.08 ng/dL (ref 0.60–1.60)

## 2022-03-04 LAB — PHOSPHORUS: Phosphorus: 3.2 mg/dL (ref 2.3–4.6)

## 2022-03-04 LAB — VITAMIN D 25 HYDROXY (VIT D DEFICIENCY, FRACTURES): VITD: 63.56 ng/mL (ref 30.00–100.00)

## 2022-03-04 LAB — MAGNESIUM: Magnesium: 1.9 mg/dL (ref 1.5–2.5)

## 2022-03-04 LAB — TSH: TSH: 2.21 u[IU]/mL (ref 0.35–5.50)

## 2022-03-04 MED ORDER — ALENDRONATE SODIUM 70 MG PO TABS
70.0000 mg | ORAL_TABLET | ORAL | 3 refills | Status: DC
  Filled 2022-05-22: qty 16, 112d supply, fill #0
  Filled 2022-09-23: qty 12, 84d supply, fill #1
  Filled 2022-12-14: qty 12, 84d supply, fill #2

## 2022-03-04 NOTE — Progress Notes (Signed)
? ? ?Name: Lindsey Davis  ?MRN/ DOB: [id number], 1961/10/01    ?Age/ Sex: 61 y.o., female   ? ?PCP: Shelva Majestic, MD   ?Reason for Endocrinology Evaluation: Osteoporosis   ?   ?Date of Initial Endocrinology Evaluation: 03/04/2022   ? ? ?HPI: ?[name] is a 61 y.o. female with a past medical history of Osteoporosis, Hypothyroidism . The patient presented for initial endocrinology clinic visit on 03/04/2022 for consultative assistance with her Osteoporosis .  ? ?Pt was diagnosed with osteoporosis: 11/2021 with a T-score -3.6 at the spine  ? ?Menarche at age : does not recall  ?Menopausal at age : at age  89 ?Fracture Hx: while skating - right wrist fracture  ?Hx of HRT: no  ?FH of osteoporosis or hip fracture: no ?Prior Hx of anti-estrogenic therapy :no  ?Prior Hx of anti-resorptive therapy : no  ? ? ?Oyster Calcium 1250 mg  ?She is also on MVI  on  gummies  ?Vitamin D 5000 iu daily  ?She is on the treadmill and runs on regular basis  ? ? ? ?HISTORY:  ?Past Medical History:  ?Past Medical History:  ?Diagnosis Date  ? Allergy   ? Anemia   ? Asthma   ? Fibrocystic breast   ? Iron deficiency anemia 09/20/2010  ? Resolved post menopause.    ? ?Past Surgical History:  ?Past Surgical History:  ?Procedure Laterality Date  ? COLONOSCOPY    ? WRIST SURGERY    ?  ?Social History:  reports that she quit smoking about 23 years ago. Her smoking use included cigarettes. She has a 10.00 pack-year smoking history. She has never used smokeless tobacco. She reports that she does not drink alcohol and does not use drugs. ?Family History: family history includes Breast cancer in her mother and sister; Cancer in her paternal grandmother; Heart attack in her father; Urinary tract infection in her mother. ? ? ?HOME MEDICATIONS: ?Allergies as of 03/04/2022   ?No Known Allergies ?  ? ?  ?Medication List  ?  ? ?  ? Accurate as of March 04, 2022  8:56 AM. If you have any questions, ask your nurse or doctor.  ?  ?  ? ?   ? ?acetaminophen 500 MG tablet ?Commonly known as: TYLENOL ?Take 500 mg by mouth every 6 (six) hours as needed. ?  ?albuterol 108 (90 Base) MCG/ACT inhaler ?Commonly known as: VENTOLIN HFA ?Inhale 2 puffs into the lungs every 6 (six) hours as needed for wheezing or shortness of breath (cough, shortness of breath or wheezing.). ?  ?amphetamine-dextroamphetamine 30 MG 24 hr capsule ?Commonly known as: ADDERALL XR ?Take 30 mg by mouth daily. ?  ?amphetamine-dextroamphetamine 20 MG tablet ?Commonly known as: Adderall ?Take 1 and 1/2 tablet by mouth once a day ?  ?ARIPiprazole 2 MG tablet ?Commonly known as: ABILIFY ?Take 2 mg by mouth daily. ?  ?calcium carbonate 1250 (500 Ca) MG tablet ?Commonly known as: OS-CAL - dosed in mg of elemental calcium ?Take 1 tablet by mouth. ?  ?ferrous sulfate 324 MG Tbec ?Take 324 mg by mouth. ?  ?levothyroxine 50 MCG tablet ?Commonly known as: SYNTHROID ?Take 50 mcg by mouth daily. ?  ?Melatonin 10 MG Tabs ?Take by mouth. ?  ?multivitamin capsule ?Take 1 capsule by mouth daily. ?  ?traZODone 50 MG tablet ?Commonly known as: DESYREL ?TAKE TWO TO THREE AT BEDTIME ?  ?VITAMIN D3 PO ?Take by mouth. ?  ?vortioxetine HBr 20 MG  Tabs tablet ?Commonly known as: TRINTELLIX ?Take 20 mg by mouth daily. ?  ? ?  ?  ? ? ?REVIEW OF SYSTEMS: ?A comprehensive ROS was conducted with the patient and is negative except as per HPI and below:  ?Review of Systems  ?Gastrointestinal:  Negative for constipation, diarrhea and heartburn.  ?Musculoskeletal:  Negative for joint pain.   ? ? ? ?OBJECTIVE:  ?VS: BP 112/74 (BP Location: Left Arm, Patient Position: Sitting, Cuff Size: Small)   Pulse 64   Ht 5' 4.5" (1.638 m)   Wt 181 lb (82.1 kg)   LMP 03/31/2011   SpO2 96%   BMI 30.59 kg/m?   ? ?Wt Readings from Last 3 Encounters:  ?03/04/22 181 lb (82.1 kg)  ?01/17/22 181 lb 12.8 oz (82.5 kg)  ?11/23/21 185 lb 9.6 oz (84.2 kg)  ? ? ? ?EXAM: ?General: Pt appears well and is in NAD  ?Neck: General: Supple without  adenopathy. ?Thyroid: Thyroid size normal.  No goiter or nodules appreciated.   ?Lungs: Clear with good BS bilat with no rales, rhonchi, or wheezes  ?Heart: Auscultation: RRR.  ?Abdomen: Normoactive bowel sounds, soft, nontender, without masses or organomegaly palpable  ?Extremities:  ?BL LE: No pretibial edema normal ROM and strength.  ?Mental Status: Judgment, insight: Intact ?Orientation: Oriented to time, place, and person ?Mood and affect: No depression, anxiety, or agitation  ? ? ? ?DATA REVIEWED: ? ?  ? Latest Reference Range & Units 03/04/22 09:34  ?Sodium 135 - 145 mEq/L 137  ?Potassium 3.5 - 5.1 mEq/L 4.5  ?Chloride 96 - 112 mEq/L 102  ?CO2 19 - 32 mEq/L 29  ?Glucose 70 - 99 mg/dL 90  ?BUN 6 - 23 mg/dL 23  ?Creatinine 0.40 - 1.20 mg/dL 1.190.65  ?Calcium 8.4 - 10.5 mg/dL 9.2  ?Phosphorus 2.3 - 4.6 mg/dL 3.2  ?Magnesium 1.5 - 2.5 mg/dL 1.9  ?GFR >60.00 mL/min 95.02  ? ? Latest Reference Range & Units 03/04/22 09:34  ?VITD 30.00 - 100.00 ng/mL 63.56  ? ? Latest Reference Range & Units 03/04/22 09:34  ?Glucose 70 - 99 mg/dL 90  ?TSH 0.35 - 5.50 uIU/mL 2.21  ?T4,Free(Direct) 0.60 - 1.60 ng/dL 1.471.08  ? ? ? ?DXA 12/13/2021 ? ?Results: ?  Lumbar spine L1-L4 Femoral neck (FN)  ?T-score -3.6 RFN: -1.9 ?LFN: -2.0  ? ? ? ?ASSESSMENT/PLAN/RECOMMENDATIONS:  ? ?Osteoporosis : ? ?- We emphasized the importance of calcium and vitamin D  ?- I have asked her to reduce calcium intake to a total of 1200 mg daily through supplements ?- Her fracture is not considered a fragility as this was a skiing incident  ?- Also emphasized importance of weight bearing exercises  ?- We discussed variable options of osteoporosis treatment to include bisphosphonates, prolia, PTH- analogues and romosuzumab  ?- After discussed the risks and benefits we opted to proceed with Alendronate  ?-We discussed rare side effect of atypical fractures and osteonecrosis. Pt encouraged to have an updated dental exam ?- Labs show normal BMP, vitamin D, magnesium,  TFTs, and phosphorus ? ? ?Medications : ?Start Alendronate  70 mg weekly  ?Calcium 1200 mg daily  ?Vitamin D 5000 iu daily  ? ? ? ? ?F/U in 6 months  ? ?Signed electronically by: ?Abby Raelyn MoraJaralla Angeleen Horney, MD ? ?[facility]  ?[facility] ?[address] ?[place], [place] [address] ?Phone: [phone] ?FAX: [phone] ? ? ?CC: ?Shelva MajesticHunter, Stephen O, MD ?[address] ?[place] [address] ?Phone: [phone] ?Fax:  [phone] ? ? ?Return to Endocrinology clinic as below: ?No future appointments. ?  ? ? ? ? ? ?

## 2022-03-04 NOTE — Patient Instructions (Addendum)
Calcium 1200 mg daily  ?Weight bearing exercises  ? ? ? ? ?24-Hour Urine Collection ? ?You will be collecting your urine for a 24-hour period of time. ?Your timer starts with your first urine of the morning (For example - If you first pee at 9AM, your timer will start at 9AM) ?Throw away your first urine of the morning ?Collect your urine every time you pee for the next 24 hours ?STOP your urine collection 24 hours after you started the collection (For example - You would stop at 9AM the day after you started) ? ?

## 2022-03-05 LAB — PARATHYROID HORMONE, INTACT (NO CA): PTH: 35 pg/mL (ref 16–77)

## 2022-03-08 ENCOUNTER — Other Ambulatory Visit (HOSPITAL_COMMUNITY): Payer: Self-pay

## 2022-03-08 MED ORDER — AMPHETAMINE-DEXTROAMPHET ER 30 MG PO CP24
ORAL_CAPSULE | ORAL | 0 refills | Status: DC
  Filled 2022-03-14: qty 30, 30d supply, fill #0

## 2022-03-08 MED ORDER — AMPHETAMINE-DEXTROAMPHETAMINE 20 MG PO TABS
ORAL_TABLET | ORAL | 0 refills | Status: DC
  Filled 2022-03-14: qty 45, 30d supply, fill #0

## 2022-03-09 ENCOUNTER — Other Ambulatory Visit (HOSPITAL_COMMUNITY): Payer: Self-pay

## 2022-03-11 ENCOUNTER — Other Ambulatory Visit: Payer: 59

## 2022-03-11 ENCOUNTER — Other Ambulatory Visit: Payer: Self-pay

## 2022-03-11 DIAGNOSIS — M810 Age-related osteoporosis without current pathological fracture: Secondary | ICD-10-CM

## 2022-03-11 NOTE — Progress Notes (Unsigned)
Total volume 1,000.  Started 03-09-2022 ended 03-10-2022. ?

## 2022-03-13 ENCOUNTER — Other Ambulatory Visit (HOSPITAL_COMMUNITY): Payer: Self-pay

## 2022-03-13 LAB — CREATININE, URINE, 24 HOUR: Creatinine, 24H Ur: 1.18 g/(24.h) (ref 0.50–2.15)

## 2022-03-13 LAB — CALCIUM, URINE, 24 HOUR: Calcium, 24H Urine: 330 mg/24 h — ABNORMAL HIGH

## 2022-03-14 ENCOUNTER — Other Ambulatory Visit (HOSPITAL_COMMUNITY): Payer: Self-pay

## 2022-04-13 ENCOUNTER — Other Ambulatory Visit (HOSPITAL_COMMUNITY): Payer: Self-pay

## 2022-04-13 MED ORDER — AMPHETAMINE-DEXTROAMPHETAMINE 20 MG PO TABS
ORAL_TABLET | ORAL | 0 refills | Status: DC
  Filled 2022-04-13: qty 45, 30d supply, fill #0

## 2022-04-13 MED ORDER — AMPHETAMINE-DEXTROAMPHET ER 30 MG PO CP24
ORAL_CAPSULE | ORAL | 0 refills | Status: DC
  Filled 2022-04-13: qty 30, 30d supply, fill #0

## 2022-04-16 ENCOUNTER — Encounter: Payer: Self-pay | Admitting: Family Medicine

## 2022-04-16 ENCOUNTER — Other Ambulatory Visit (HOSPITAL_COMMUNITY): Payer: Self-pay

## 2022-05-15 ENCOUNTER — Other Ambulatory Visit (HOSPITAL_COMMUNITY): Payer: Self-pay

## 2022-05-15 MED ORDER — AMPHETAMINE-DEXTROAMPHETAMINE 20 MG PO TABS
ORAL_TABLET | ORAL | 0 refills | Status: DC
  Filled 2022-05-15: qty 45, 30d supply, fill #0

## 2022-05-15 MED ORDER — AMPHETAMINE-DEXTROAMPHET ER 30 MG PO CP24
ORAL_CAPSULE | ORAL | 0 refills | Status: DC
  Filled 2022-05-15: qty 30, 30d supply, fill #0

## 2022-05-20 ENCOUNTER — Other Ambulatory Visit (HOSPITAL_COMMUNITY): Payer: Self-pay

## 2022-05-21 ENCOUNTER — Other Ambulatory Visit (HOSPITAL_COMMUNITY): Payer: Self-pay

## 2022-05-21 MED ORDER — TRAZODONE HCL 50 MG PO TABS
ORAL_TABLET | ORAL | 0 refills | Status: DC
  Filled 2022-05-24: qty 270, 90d supply, fill #0

## 2022-05-22 ENCOUNTER — Other Ambulatory Visit (HOSPITAL_COMMUNITY): Payer: Self-pay

## 2022-05-22 MED ORDER — ARIPIPRAZOLE 2 MG PO TABS
ORAL_TABLET | ORAL | 4 refills | Status: DC
  Filled 2022-05-22 – 2022-06-08 (×2): qty 90, 90d supply, fill #0

## 2022-05-22 MED ORDER — ALBUTEROL SULFATE HFA 108 (90 BASE) MCG/ACT IN AERS
INHALATION_SPRAY | RESPIRATORY_TRACT | 5 refills | Status: DC
  Filled 2022-05-22: qty 8.5, 30d supply, fill #0

## 2022-05-24 ENCOUNTER — Other Ambulatory Visit (HOSPITAL_COMMUNITY): Payer: Self-pay

## 2022-05-25 ENCOUNTER — Other Ambulatory Visit (HOSPITAL_COMMUNITY): Payer: Self-pay

## 2022-06-03 ENCOUNTER — Other Ambulatory Visit (HOSPITAL_COMMUNITY): Payer: Self-pay

## 2022-06-08 ENCOUNTER — Other Ambulatory Visit (HOSPITAL_COMMUNITY): Payer: Self-pay

## 2022-06-10 ENCOUNTER — Other Ambulatory Visit (HOSPITAL_COMMUNITY): Payer: Self-pay

## 2022-06-10 MED ORDER — AMPHETAMINE-DEXTROAMPHETAMINE 20 MG PO TABS
30.0000 mg | ORAL_TABLET | Freq: Every day | ORAL | 0 refills | Status: DC
  Filled 2022-06-17: qty 45, 30d supply, fill #0

## 2022-06-10 MED ORDER — AMPHETAMINE-DEXTROAMPHET ER 30 MG PO CP24
30.0000 mg | ORAL_CAPSULE | Freq: Every morning | ORAL | 0 refills | Status: DC
Start: 2022-06-14 — End: 2022-07-10
  Filled 2022-06-17: qty 30, 30d supply, fill #0

## 2022-06-11 ENCOUNTER — Other Ambulatory Visit (HOSPITAL_COMMUNITY): Payer: Self-pay

## 2022-06-17 ENCOUNTER — Other Ambulatory Visit (HOSPITAL_COMMUNITY): Payer: Self-pay

## 2022-06-20 ENCOUNTER — Other Ambulatory Visit (HOSPITAL_COMMUNITY): Payer: Self-pay

## 2022-06-20 MED ORDER — VORTIOXETINE HBR 20 MG PO TABS
ORAL_TABLET | ORAL | 0 refills | Status: DC
  Filled 2022-06-20: qty 30, 30d supply, fill #0
  Filled 2022-07-15: qty 30, 30d supply, fill #1

## 2022-06-21 ENCOUNTER — Other Ambulatory Visit (HOSPITAL_COMMUNITY): Payer: Self-pay

## 2022-06-24 ENCOUNTER — Other Ambulatory Visit (HOSPITAL_COMMUNITY): Payer: Self-pay

## 2022-07-10 ENCOUNTER — Other Ambulatory Visit (HOSPITAL_COMMUNITY): Payer: Self-pay

## 2022-07-10 MED ORDER — AMPHETAMINE-DEXTROAMPHET ER 30 MG PO CP24
30.0000 mg | ORAL_CAPSULE | Freq: Every morning | ORAL | 0 refills | Status: DC
  Filled 2022-07-17: qty 30, 30d supply, fill #0

## 2022-07-10 MED ORDER — AMPHETAMINE-DEXTROAMPHETAMINE 20 MG PO TABS
ORAL_TABLET | ORAL | 0 refills | Status: DC
  Filled 2022-07-17: qty 45, 30d supply, fill #0

## 2022-07-11 ENCOUNTER — Other Ambulatory Visit (HOSPITAL_COMMUNITY): Payer: Self-pay

## 2022-07-12 ENCOUNTER — Other Ambulatory Visit (HOSPITAL_COMMUNITY): Payer: Self-pay

## 2022-07-15 ENCOUNTER — Other Ambulatory Visit (HOSPITAL_COMMUNITY): Payer: Self-pay

## 2022-07-17 ENCOUNTER — Other Ambulatory Visit (HOSPITAL_COMMUNITY): Payer: Self-pay

## 2022-08-05 ENCOUNTER — Other Ambulatory Visit (HOSPITAL_COMMUNITY): Payer: Self-pay

## 2022-08-09 ENCOUNTER — Other Ambulatory Visit (HOSPITAL_COMMUNITY): Payer: Self-pay

## 2022-08-09 MED ORDER — TRAZODONE HCL 50 MG PO TABS
ORAL_TABLET | ORAL | 0 refills | Status: DC
  Filled 2022-08-09: qty 270, 90d supply, fill #0

## 2022-08-13 ENCOUNTER — Other Ambulatory Visit (HOSPITAL_COMMUNITY): Payer: Self-pay

## 2022-08-14 ENCOUNTER — Other Ambulatory Visit (HOSPITAL_COMMUNITY): Payer: Self-pay

## 2022-08-15 ENCOUNTER — Other Ambulatory Visit (HOSPITAL_COMMUNITY): Payer: Self-pay

## 2022-08-15 MED ORDER — AMPHETAMINE-DEXTROAMPHETAMINE 20 MG PO TABS
ORAL_TABLET | ORAL | 0 refills | Status: DC
  Filled 2022-08-16: qty 45, 30d supply, fill #0

## 2022-08-15 MED ORDER — AMPHETAMINE-DEXTROAMPHET ER 30 MG PO CP24
30.0000 mg | ORAL_CAPSULE | Freq: Every morning | ORAL | 0 refills | Status: DC
  Filled 2022-08-16: qty 30, 30d supply, fill #0

## 2022-08-16 ENCOUNTER — Other Ambulatory Visit (HOSPITAL_COMMUNITY): Payer: Self-pay

## 2022-08-28 ENCOUNTER — Telehealth: Payer: Self-pay | Admitting: Family Medicine

## 2022-08-28 NOTE — Telephone Encounter (Signed)
Pt states: -CPE completed 12/13/21  Pt requests: -proof of CPE emailed to [email or web address]

## 2022-08-28 NOTE — Telephone Encounter (Signed)
CPE email.   Patient notified.

## 2022-08-31 ENCOUNTER — Other Ambulatory Visit (HOSPITAL_COMMUNITY): Payer: Self-pay

## 2022-09-02 ENCOUNTER — Ambulatory Visit: Payer: No Typology Code available for payment source | Admitting: Internal Medicine

## 2022-09-02 ENCOUNTER — Encounter: Payer: Self-pay | Admitting: Internal Medicine

## 2022-09-02 ENCOUNTER — Other Ambulatory Visit (HOSPITAL_COMMUNITY): Payer: Self-pay

## 2022-09-02 VITALS — BP 118/70 | HR 71 | Ht 64.5 in | Wt 178.0 lb

## 2022-09-02 DIAGNOSIS — M810 Age-related osteoporosis without current pathological fracture: Secondary | ICD-10-CM | POA: Diagnosis not present

## 2022-09-02 DIAGNOSIS — R82994 Hypercalciuria: Secondary | ICD-10-CM

## 2022-09-02 LAB — VITAMIN D 25 HYDROXY (VIT D DEFICIENCY, FRACTURES): VITD: 58.89 ng/mL (ref 30.00–100.00)

## 2022-09-02 LAB — BASIC METABOLIC PANEL
BUN: 18 mg/dL (ref 6–23)
CO2: 27 mEq/L (ref 19–32)
Calcium: 9.1 mg/dL (ref 8.4–10.5)
Chloride: 103 mEq/L (ref 96–112)
Creatinine, Ser: 0.69 mg/dL (ref 0.40–1.20)
GFR: 93.33 mL/min (ref >60.00)
Glucose, Bld: 100 mg/dL — ABNORMAL HIGH (ref 70–99)
Potassium: 4 mEq/L (ref 3.5–5.1)
Sodium: 138 mEq/L (ref 135–145)

## 2022-09-02 MED ORDER — TRINTELLIX 20 MG PO TABS
20.0000 mg | ORAL_TABLET | Freq: Every day | ORAL | 0 refills | Status: DC
  Filled 2022-09-02: qty 90, 90d supply, fill #0

## 2022-09-02 NOTE — Progress Notes (Signed)
Name: Lindsey Davis  MRN/ DOB: [id number], [birth year]    Age/ Sex: 61 y.o., female    PCP: Shelva Majestic, MD   Reason for Endocrinology Evaluation: Osteoporosis      Date of Initial Endocrinology Evaluation: 03/04/2022    HPI: Lindsey Davis is a 61 y.o. female with a past medical history of Osteoporosis, Hypothyroidism . The patient presented for initial endocrinology clinic visit on 03/04/2022  for consultative assistance with her Osteoporosis .   Pt was diagnosed with osteoporosis: 11/2021 with a T-score -3.6 at the spine   Menarche at age : does not recall  Menopausal at age : at age  81 Fracture Hx: while skating - right wrist fracture  Hx of HRT: no  FH of osteoporosis or hip fracture: no Prior Hx of anti-estrogenic therapy :no  Prior Hx of anti-resorptive therapy : Started alendronate in March 2023   On her initial visit to our clinic she had normal GFR, serum calcium, phosphorus, magnesium and a vitamin D of 63.56 NG/mL, PTH normal at 35 PG/mL, normal TFTs but she has been noted with elevated 24-hour urinary calcium excretion at 330 mg      SUBJECTIVE:    Today (09/02/22): Lindsey Davis is here for a follow up on osteoporosis.  She is tolerating alendronate without side effects  Denies joint aches and pain except for the rate lower back ache Denies falls or bone fracture  Denies  She does on add salt to her diet   Oyster Calcium 1250 mg  She is also on MVI    gummies  Vitamin D 5000 iu daily    HISTORY:  Past Medical History:  Past Medical History:  Diagnosis Date   Allergy    Anemia    Asthma    Fibrocystic breast    Iron deficiency anemia 09/20/2010   Resolved post menopause.     Past Surgical History:  Past Surgical History:  Procedure Laterality Date   COLONOSCOPY     WRIST SURGERY      Social History:  reports that she quit smoking about 23 years ago. Her smoking use included cigarettes. She has a 10.00 pack-year smoking history.  She has never used smokeless tobacco. She reports that she does not drink alcohol and does not use drugs. Family History: family history includes Breast cancer in her mother and sister; Cancer in her paternal grandmother; Heart attack in her father; Urinary tract infection in her mother.   HOME MEDICATIONS: Allergies as of 09/02/2022   No Known Allergies      Medication List        Accurate as of September 02, 2022 12:51 PM. If you have any questions, ask your nurse or doctor.          acetaminophen 500 MG tablet Commonly known as: TYLENOL Take 500 mg by mouth every 6 (six) hours as needed.   albuterol 108 (90 Base) MCG/ACT inhaler Commonly known as: VENTOLIN HFA Inhale 2 puffs into the lungs every 6 (six) hours as needed for wheezing or shortness of breath (cough, shortness of breath or wheezing.).   albuterol 108 (90 Base) MCG/ACT inhaler Commonly known as: VENTOLIN HFA Inhale into the lungs as directed.   alendronate 70 MG tablet Commonly known as: FOSAMAX Take 1 tablet (70 mg total) by mouth every 7 (seven) days. Take with a full glass of water on an empty stomach.   amphetamine-dextroamphetamine 30 MG 24 hr capsule Commonly known as: ADDERALL  XR Take 30 mg by mouth daily.   amphetamine-dextroamphetamine 20 MG tablet Commonly known as: Adderall Take 1 and 1/2 tablet by mouth once a day   amphetamine-dextroamphetamine 30 MG 24 hr capsule Commonly known as: Adderall XR Take 1 capsule by mouth every morning.   amphetamine-dextroamphetamine 20 MG tablet Commonly known as: Adderall Take 1 and 1/2 tablets by mouth daily.   amphetamine-dextroamphetamine 20 MG tablet Commonly known as: Adderall take 1 and 1/2 tablet daily (1 1/2 PO QD)   amphetamine-dextroamphetamine 30 MG 24 hr capsule Commonly known as: Adderall XR take 1 tablet by mouth daily in the morning (1 P.O. QAM)   amphetamine-dextroamphetamine 20 MG tablet Commonly known as: Adderall Take 1 and  1/2 tablets by mouth daily.   amphetamine-dextroamphetamine 30 MG 24 hr capsule Commonly known as: Adderall XR Take 1 capsule by mouth every morning.   amphetamine-dextroamphetamine 20 MG tablet Commonly known as: Adderall Take 1 and 1/2 tablets by mouth daily * Do not fill until 06/14/22   amphetamine-dextroamphetamine 20 MG tablet Commonly known as: Adderall Take 1 and 1/2 tablets by mouth daily * do not fill until 08/02   amphetamine-dextroamphetamine 20 MG tablet Commonly known as: Adderall Take 1 & 1/2 tablets by mouth daily (fill 08/16/22)   Adderall XR 30 MG 24 hr capsule Generic drug: amphetamine-dextroamphetamine Take 1 capsule by mouth every morning (fill 08/16/22)   ARIPiprazole 2 MG tablet Commonly known as: ABILIFY Take 2 mg by mouth daily.   ARIPiprazole 2 MG tablet Commonly known as: ABILIFY Take 1 tablet by mouth every day at bedtime.   calcium carbonate 1250 (500 Ca) MG tablet Commonly known as: OS-CAL - dosed in mg of elemental calcium Take 1 tablet by mouth.   ferrous sulfate 324 MG Tbec Take 324 mg by mouth.   levothyroxine 50 MCG tablet Commonly known as: SYNTHROID Take 50 mcg by mouth daily.   Melatonin 10 MG Tabs Take by mouth.   multivitamin capsule Take 1 capsule by mouth daily.   traZODone 50 MG tablet Commonly known as: DESYREL Take 2 - 3 tablets by mouth at bedtime What changed: Another medication with the same name was removed. Continue taking this medication, and follow the directions you see here. Changed by: Dorita Sciara, MD   Trintellix 20 MG Tabs tablet Generic drug: vortioxetine HBr Take 1 tablet by mouth daily.   Trintellix 20 MG Tabs tablet Generic drug: vortioxetine HBr Take 1 tablet by mouth every day   VITAMIN D3 PO Take by mouth.           OBJECTIVE:  VS: BP 118/70 (BP Location: Left Arm, Patient Position: Sitting, Cuff Size: Small)   Pulse 71   Ht 5' 4.5" (1.638 m)   Wt 178 lb (80.7 kg)   LMP  03/31/2011   SpO2 99%   BMI 30.08 kg/m    Wt Readings from Last 3 Encounters:  09/02/22 178 lb (80.7 kg)  03/04/22 181 lb (82.1 kg)  01/17/22 181 lb 12.8 oz (82.5 kg)     EXAM: General: Pt appears well and is in NAD  Neck: General: Supple without adenopathy. Thyroid: Thyroid size normal.  No goiter or nodules appreciated.   Lungs: Clear with good BS bilat with no rales, rhonchi, or wheezes  Heart: Auscultation: RRR.  Abdomen: Normoactive bowel sounds, soft, nontender, without masses or organomegaly palpable  Extremities:  BL LE: No pretibial edema normal ROM and strength.  Mental Status: Judgment, insight: Intact Orientation: Oriented to  time, place, and person Mood and affect: No depression, anxiety, or agitation     DATA REVIEWED:  Latest Reference Range & Units 09/02/22 09:27  Sodium 135 - 145 mEq/L 138  Potassium 3.5 - 5.1 mEq/L 4.0  Chloride 96 - 112 mEq/L 103  CO2 19 - 32 mEq/L 27  Glucose 70 - 99 mg/dL 657 (H)  BUN 6 - 23 mg/dL 18  Creatinine 8.46 - 9.62 mg/dL 9.52  Calcium 8.4 - 84.1 mg/dL 9.1  GFR >32.44 mL/min 93.33    Latest Reference Range & Units 09/02/22 09:27  VITD 30.00 - 100.00 ng/mL 58.89       Latest Reference Range & Units 03/04/22 09:34  Glucose 70 - 99 mg/dL 90  TSH 0.10 - 2.72 uIU/mL 2.21  T4,Free(Direct) 0.60 - 1.60 ng/dL 5.36    Latest Reference Range & Units 03/11/22 08:26  Calcium, 24H Urine mg/24 h 330 (H)  Creatinine, 24H Ur 0.50 - 2.15 g/24 h 1.18  (H): Data is abnormally high   DXA 12/13/2021  Results:   Lumbar spine L1-L4 Femoral neck (FN)  T-score -3.6 RFN: -1.9 LFN: -2.0     ASSESSMENT/PLAN/RECOMMENDATIONS:   Osteoporosis :  - We emphasized the importance of calcium and vitamin D  - I have asked her to reduce calcium intake to a total of 1200 mg daily through supplements - Her fracture is not considered a fragility as this was a skiing incident  -She is tolerating alendronate without any side effects - Labs  showed  normal BMP, vitamin D, magnesium, TFTs,phosphorus, and PTH in the past -Repeat BMP today is normal   Medications : Continue alendronate  70 mg weekly  Calcium 1200 mg daily  Vitamin D 3000 iu daily    2. Hypercalciuria:  -Unclear cause at this time -Discussed the importance of avoiding salt as well as reducing amount of protein intake if possible -I am also going to reduce vitamin D3 from 5000 IU daily to 3000 IU daily -She was also advised to take no more than 1200 mg of calcium daily -We will consider repeating 24-hour urinary calcium next year     F/U in 1 year Signed electronically by: Lyndle Herrlich, MD  [facility]  [facility] [address] [place], [place] [address] Phone: [phone] FAX: [phone]   CC: Shelva Majestic, MD [address] [place] [address] Phone: [phone] Fax: [phone]   Return to Endocrinology clinic as below: Future Appointments  Date Time Provider Department Center  09/03/2023  8:30 AM Venesa Semidey, Konrad Dolores, MD [id number] None

## 2022-09-02 NOTE — Patient Instructions (Addendum)
Calcium  no more than 1200 mg daily   Continue Alendronate 70 mg , 1 tablet once weekly

## 2022-09-09 ENCOUNTER — Encounter: Payer: Self-pay | Admitting: *Deleted

## 2022-09-09 ENCOUNTER — Other Ambulatory Visit (HOSPITAL_COMMUNITY): Payer: Self-pay

## 2022-09-17 ENCOUNTER — Other Ambulatory Visit (HOSPITAL_COMMUNITY): Payer: Self-pay

## 2022-09-17 MED ORDER — LEVOTHYROXINE SODIUM 50 MCG PO TABS
50.0000 ug | ORAL_TABLET | Freq: Every morning | ORAL | 0 refills | Status: DC
  Filled 2022-09-17: qty 90, 90d supply, fill #0
  Filled 2022-12-28: qty 90, 90d supply, fill #1
  Filled 2023-01-21: qty 90, 90d supply, fill #2

## 2022-09-21 ENCOUNTER — Other Ambulatory Visit (HOSPITAL_COMMUNITY): Payer: Self-pay

## 2022-09-23 ENCOUNTER — Other Ambulatory Visit (HOSPITAL_COMMUNITY): Payer: Self-pay

## 2022-09-24 ENCOUNTER — Other Ambulatory Visit (HOSPITAL_COMMUNITY): Payer: Self-pay

## 2022-09-24 MED ORDER — AMPHETAMINE-DEXTROAMPHET ER 30 MG PO CP24
30.0000 mg | ORAL_CAPSULE | Freq: Every morning | ORAL | 0 refills | Status: DC
  Filled 2022-09-24: qty 30, 30d supply, fill #0

## 2022-09-24 MED ORDER — AMPHETAMINE-DEXTROAMPHETAMINE 20 MG PO TABS
30.0000 mg | ORAL_TABLET | Freq: Every day | ORAL | 0 refills | Status: DC
  Filled 2022-09-24: qty 45, 30d supply, fill #0

## 2022-09-25 ENCOUNTER — Other Ambulatory Visit (HOSPITAL_COMMUNITY): Payer: Self-pay

## 2022-09-28 ENCOUNTER — Other Ambulatory Visit (HOSPITAL_COMMUNITY): Payer: Self-pay

## 2022-10-01 ENCOUNTER — Other Ambulatory Visit (HOSPITAL_COMMUNITY): Payer: Self-pay

## 2022-10-08 ENCOUNTER — Other Ambulatory Visit: Payer: Self-pay

## 2022-10-28 ENCOUNTER — Other Ambulatory Visit (HOSPITAL_COMMUNITY): Payer: Self-pay

## 2022-10-28 MED ORDER — AMPHETAMINE-DEXTROAMPHET ER 30 MG PO CP24
30.0000 mg | ORAL_CAPSULE | Freq: Every morning | ORAL | 0 refills | Status: DC
  Filled 2022-10-28: qty 30, 30d supply, fill #0

## 2022-10-28 MED ORDER — AMPHETAMINE-DEXTROAMPHETAMINE 20 MG PO TABS
30.0000 mg | ORAL_TABLET | Freq: Every day | ORAL | 0 refills | Status: DC
  Filled 2022-10-28: qty 45, 30d supply, fill #0

## 2022-10-29 ENCOUNTER — Other Ambulatory Visit (HOSPITAL_COMMUNITY): Payer: Self-pay

## 2022-10-29 MED ORDER — TRAZODONE HCL 50 MG PO TABS
100.0000 mg | ORAL_TABLET | Freq: Every evening | ORAL | 0 refills | Status: DC
  Filled 2022-10-29: qty 270, 90d supply, fill #0

## 2022-10-29 MED ORDER — ARIPIPRAZOLE 2 MG PO TABS
2.0000 mg | ORAL_TABLET | Freq: Every evening | ORAL | 0 refills | Status: DC
  Filled 2022-10-29: qty 90, 90d supply, fill #0

## 2022-10-30 ENCOUNTER — Other Ambulatory Visit (HOSPITAL_COMMUNITY): Payer: Self-pay

## 2022-11-23 ENCOUNTER — Other Ambulatory Visit (HOSPITAL_COMMUNITY): Payer: Self-pay

## 2022-11-25 ENCOUNTER — Other Ambulatory Visit (HOSPITAL_COMMUNITY): Payer: Self-pay

## 2022-11-25 MED ORDER — AMPHETAMINE-DEXTROAMPHETAMINE 20 MG PO TABS
30.0000 mg | ORAL_TABLET | Freq: Every day | ORAL | 0 refills | Status: DC
  Filled 2022-11-27 (×3): qty 45, 30d supply, fill #0

## 2022-11-25 MED ORDER — AMPHETAMINE-DEXTROAMPHET ER 30 MG PO CP24
30.0000 mg | ORAL_CAPSULE | Freq: Every morning | ORAL | 0 refills | Status: DC
  Filled 2022-11-27 – 2022-11-30 (×5): qty 30, 30d supply, fill #0

## 2022-11-26 ENCOUNTER — Other Ambulatory Visit (HOSPITAL_COMMUNITY): Payer: Self-pay

## 2022-11-26 ENCOUNTER — Ambulatory Visit (INDEPENDENT_AMBULATORY_CARE_PROVIDER_SITE_OTHER): Payer: No Typology Code available for payment source | Admitting: Family Medicine

## 2022-11-26 ENCOUNTER — Encounter: Payer: Self-pay | Admitting: Family Medicine

## 2022-11-26 VITALS — BP 122/80 | HR 90 | Temp 98.1°F | Wt 187.0 lb

## 2022-11-26 DIAGNOSIS — U071 COVID-19: Secondary | ICD-10-CM | POA: Insufficient documentation

## 2022-11-26 DIAGNOSIS — J029 Acute pharyngitis, unspecified: Secondary | ICD-10-CM

## 2022-11-26 DIAGNOSIS — R059 Cough, unspecified: Secondary | ICD-10-CM

## 2022-11-26 LAB — POC COVID19 BINAXNOW: SARS Coronavirus 2 Ag: POSITIVE — AB

## 2022-11-26 LAB — POCT RAPID STREP A (OFFICE): Rapid Strep A Screen: NEGATIVE

## 2022-11-26 LAB — POCT INFLUENZA A/B
Influenza A, POC: NEGATIVE
Influenza B, POC: NEGATIVE

## 2022-11-26 MED ORDER — NIRMATRELVIR/RITONAVIR (PAXLOVID)TABLET
3.0000 | ORAL_TABLET | Freq: Two times a day (BID) | ORAL | 0 refills | Status: AC
  Filled 2022-11-26: qty 30, 5d supply, fill #0

## 2022-11-26 MED ORDER — FLUTICASONE PROPIONATE 50 MCG/ACT NA SUSP
2.0000 | Freq: Every day | NASAL | 6 refills | Status: DC
  Filled 2022-11-26: qty 16, 30d supply, fill #0

## 2022-11-26 MED ORDER — BENZONATATE 200 MG PO CAPS
200.0000 mg | ORAL_CAPSULE | Freq: Two times a day (BID) | ORAL | 0 refills | Status: DC | PRN
  Filled 2022-11-26: qty 20, 10d supply, fill #0

## 2022-11-26 NOTE — Assessment & Plan Note (Signed)
High risk based on age and chronic comorbidities including asthma Discussed treatment options including antivirals and supportive care Discussed quarantine precautions

## 2022-11-26 NOTE — Progress Notes (Signed)
Assessment/Plan:   Problem List Items Addressed This Visit       Other   COVID-19    High risk based on age and chronic comorbidities including asthma Discussed treatment options including antivirals and supportive care Discussed quarantine precautions       Relevant Medications   nirmatrelvir/ritonavir EUA (PAXLOVID) 20 x 150 MG & 10 x 100MG  TABS   benzonatate (TESSALON) 200 MG capsule   fluticasone (FLONASE) 50 MCG/ACT nasal spray   Other Visit Diagnoses     Cough, unspecified type    -  Primary   Relevant Orders   POC COVID-19 (Completed)   POCT Influenza A/B (Completed)   Sore throat       Relevant Orders   POCT rapid strep A (Completed)      Return precautions and ED precautions discussed    Subjective:  HPI:  Collie Kittel is a 61 y.o. female who has Depression; Attention deficit disorder; Allergic rhinitis; Asthma; Obesity; Hypothyroidism; Osteoporosis; Age-related osteoporosis without current pathological fracture; and COVID-19 on their problem list..   She  has a past medical history of Allergy, Anemia, Asthma, Fibrocystic breast, and Iron deficiency anemia (09/20/2010)..   She presents with chief complaint of Cough (Cough, congestion and sore throat x 1 day.) .   COVID19. She complains of congestion, cough described as waxing and waning over time, and headache .with no fever, chills, night sweats or weight loss. Onset of symptoms was yesterday and staying constant.She is drinking plenty of fluids.  Patient tested positive for COVID today. Evaluation to date: none. Treatment to date: antihistamines and cough suppressants. Past history is significant for asthma. Patient is non-smoker. COVID Vaccination status: Original series: Yes, Booster: Yes 2022.  Patient not been using albuterol inhaler. ROS: The patient denies chest pain, dyspnea, wheezing or hemoptysis.   Past Surgical History:  Procedure Laterality Date   COLONOSCOPY     WRIST SURGERY       Outpatient Medications Prior to Visit  Medication Sig Dispense Refill   acetaminophen (TYLENOL) 500 MG tablet Take 500 mg by mouth every 6 (six) hours as needed.     albuterol (VENTOLIN HFA) 108 (90 Base) MCG/ACT inhaler Inhale 2 puffs into the lungs every 6 (six) hours as needed for wheezing or shortness of breath (cough, shortness of breath or wheezing.). 18 g 5   albuterol (VENTOLIN HFA) 108 (90 Base) MCG/ACT inhaler Inhale into the lungs as directed. 8.5 g 5   alendronate (FOSAMAX) 70 MG tablet Take 1 tablet (70 mg total) by mouth every 7 (seven) days. Take with a full glass of water on an empty stomach. 13 tablet 3   amphetamine-dextroamphetamine (ADDERALL XR) 30 MG 24 hr capsule Take 30 mg by mouth daily.     amphetamine-dextroamphetamine (ADDERALL XR) 30 MG 24 hr capsule Take 1 capsule by mouth every morning. 30 capsule 0   amphetamine-dextroamphetamine (ADDERALL XR) 30 MG 24 hr capsule 1 P.O. QAM 30 capsule 0   amphetamine-dextroamphetamine (ADDERALL XR) 30 MG 24 hr capsule Take 1 capsule by mouth every morning. 30 capsule 0   [START ON 11/27/2022] amphetamine-dextroamphetamine (ADDERALL XR) 30 MG 24 hr capsule Take 1 capsule (30 mg total) by mouth every morning. (11/27/22) 30 capsule 0   amphetamine-dextroamphetamine (ADDERALL) 20 MG tablet Take 1 and 1/2 tablet by mouth once a day 45 tablet 0   amphetamine-dextroamphetamine (ADDERALL) 20 MG tablet Take 1 and 1/2 tablets by mouth daily. 45 tablet 0   amphetamine-dextroamphetamine (ADDERALL) 20  MG tablet 1 1/2 PO QD 45 tablet 0   amphetamine-dextroamphetamine (ADDERALL) 20 MG tablet Take 1 and 1/2 tablets by mouth daily. 45 tablet 0   amphetamine-dextroamphetamine (ADDERALL) 20 MG tablet Take 1 and 1/2 tablets by mouth daily * Do not fill until 06/14/22 45 tablet 0   amphetamine-dextroamphetamine (ADDERALL) 20 MG tablet Take 1 and 1/2 tablets by mouth daily * do not fill until 08/02 45 tablet 0   amphetamine-dextroamphetamine  (ADDERALL) 20 MG tablet Take 1 & 1/2 tablets by mouth daily (fill 08/16/22) 45 tablet 0   amphetamine-dextroamphetamine (ADDERALL) 20 MG tablet Take 1.5 tablets (30 mg total) by mouth daily. 45 tablet 0   [START ON 11/27/2022] amphetamine-dextroamphetamine (ADDERALL) 20 MG tablet Take 1 and 1/2 tablets (30 mg total) by mouth daily. (11/27/22) 45 tablet 0   ARIPiprazole (ABILIFY) 2 MG tablet Take 2 mg by mouth daily.     ARIPiprazole (ABILIFY) 2 MG tablet Take 1 tablet (2 mg total) by mouth at bedtime. 90 tablet 0   calcium carbonate (OS-CAL - DOSED IN MG OF ELEMENTAL CALCIUM) 1250 (500 Ca) MG tablet Take 1 tablet by mouth.     Cholecalciferol (VITAMIN D3 PO) Take by mouth.     levothyroxine (SYNTHROID) 50 MCG tablet Take 50 mcg by mouth daily.     levothyroxine (SYNTHROID) 50 MCG tablet Take 1 tablet (50 mcg total) by mouth every morning on an empty stomach and no food for 30 minutes 390 tablet 0   Melatonin 10 MG TABS Take by mouth.     Multiple Vitamin (MULTIVITAMIN) capsule Take 1 capsule by mouth daily.     traZODone (DESYREL) 50 MG tablet Take 2-3 tablets (100-150 mg total) by mouth at bedtime. 270 tablet 0   vortioxetine HBr (TRINTELLIX) 20 MG TABS tablet Take 1 tablet by mouth daily. 90 tablet 0   vortioxetine HBr (TRINTELLIX) 20 MG TABS tablet Take 1 tablet by mouth every day 90 tablet 0   ferrous sulfate 324 MG TBEC Take 324 mg by mouth. (Patient not taking: Reported on 09/02/2022)     No facility-administered medications prior to visit.    Family History  Problem Relation Age of Onset   Breast cancer Mother        72   Urinary tract infection Mother        chronic   Heart attack Father        69   Breast cancer Sister        early 65s   Cancer Paternal Grandmother    Colon cancer Neg Hx    Colon polyps Neg Hx    Esophageal cancer Neg Hx    Rectal cancer Neg Hx    Stomach cancer Neg Hx     Social History   Socioeconomic History   Marital status: Married    Spouse name:  Not on file   Number of children: Not on file   Years of education: Not on file   Highest education level: Not on file  Occupational History   Not on file  Tobacco Use   Smoking status: Former    Packs/day: 1.00    Years: 10.00    Total pack years: 10.00    Types: Cigarettes    Quit date: 12/16/1998    Years since quitting: 23.9   Smokeless tobacco: Never  Vaping Use   Vaping Use: Never used  Substance and Sexual Activity   Alcohol use: No    Alcohol/week: 0.0 standard drinks  of alcohol   Drug use: No   Sexual activity: Not on file  Other Topics Concern   Not on file  Social History Narrative   Married 1990. 3 kids (1 set of twins 53 in 2017, oldest 27- started nursing school).       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      Hobbies: enjoys exercise   Social Determinants of Corporate investment banker Strain: Not on file  Food Insecurity: Not on file  Transportation Needs: Not on file  Physical Activity: Not on file  Stress: Not on file  Social Connections: Not on file  Intimate Partner Violence: Not on file                                                                                                 Objective:  Physical Exam: BP 122/80 (BP Location: Right Arm, Patient Position: Sitting, Cuff Size: Large)   Pulse 90   Temp 98.1 F (36.7 C) (Oral)   Wt 187 lb (84.8 kg)   LMP 03/31/2011   SpO2 99%   BMI 31.60 kg/m    General: No acute distress. Awake and conversant.  Eyes: Normal conjunctiva, anicteric. Round symmetric pupils.  ENT: Hearing grossly intact. No nasal discharge.  Neck: Neck is supple. No masses or thyromegaly.  Respiratory: Respirations are non-labored.  Clear to auscultation bilaterally Skin: Warm. No rashes or ulcers.  Psych: Alert and oriented. Cooperative, Appropriate mood and  affect, Normal judgment.  CV: No cyanosis or JVD, RRR no MRG MSK: Normal ambulation. No clubbing  Neuro: Sensation and CN II-XII grossly normal.   Results for orders placed or performed in visit on 11/26/22  POC COVID-19  Result Value Ref Range   SARS Coronavirus 2 Ag Positive (A) Negative  POCT Influenza A/B  Result Value Ref Range   Influenza A, POC Negative Negative   Influenza B, POC Negative Negative  POCT rapid strep A  Result Value Ref Range   Rapid Strep A Screen Negative Negative         Garner Nash, MD, MS

## 2022-11-26 NOTE — Patient Instructions (Signed)
For COVID, Quarantine at home for 5 days since the start of illness. After this you, may exit quarantine, but please still wear a mask indoors and outdoors for 5 more days.   Please be sure to drink plenty of fluids.   Take paxlovid, flonase and tessalon perles as prescribed  You may take the following OTC medications to help with symptoms:  For cough, use  cough syrups or other cough suppressants.  For headache, sore throat, fevers, muscle aches, chills, other pain, take ibuprofen or tylenol  For congestion, use nasal sprays, decongestants, or antihistamines  Please follow up if no improvement.   Go to ED if you have severe chest pain, fevers, shortness of breath or other worrisome symptoms.

## 2022-11-27 ENCOUNTER — Encounter (HOSPITAL_COMMUNITY): Payer: Self-pay

## 2022-11-27 ENCOUNTER — Other Ambulatory Visit (HOSPITAL_COMMUNITY): Payer: Self-pay

## 2022-11-28 ENCOUNTER — Encounter: Payer: Self-pay | Admitting: *Deleted

## 2022-11-28 ENCOUNTER — Other Ambulatory Visit: Payer: Self-pay

## 2022-11-28 ENCOUNTER — Other Ambulatory Visit (HOSPITAL_COMMUNITY): Payer: Self-pay

## 2022-11-29 ENCOUNTER — Other Ambulatory Visit (HOSPITAL_COMMUNITY): Payer: Self-pay

## 2022-11-29 ENCOUNTER — Other Ambulatory Visit: Payer: Self-pay

## 2022-11-30 ENCOUNTER — Other Ambulatory Visit (HOSPITAL_COMMUNITY): Payer: Self-pay

## 2022-12-02 ENCOUNTER — Other Ambulatory Visit (HOSPITAL_COMMUNITY): Payer: Self-pay

## 2022-12-02 MED ORDER — TRINTELLIX 20 MG PO TABS
20.0000 mg | ORAL_TABLET | Freq: Every day | ORAL | 0 refills | Status: DC
  Filled 2022-12-02 (×2): qty 90, 90d supply, fill #0

## 2022-12-04 ENCOUNTER — Other Ambulatory Visit (HOSPITAL_COMMUNITY): Payer: Self-pay

## 2022-12-12 ENCOUNTER — Other Ambulatory Visit: Payer: Self-pay | Admitting: Family Medicine

## 2022-12-12 ENCOUNTER — Other Ambulatory Visit (HOSPITAL_COMMUNITY): Payer: Self-pay

## 2022-12-12 MED ORDER — ALBUTEROL SULFATE HFA 108 (90 BASE) MCG/ACT IN AERS
INHALATION_SPRAY | RESPIRATORY_TRACT | 5 refills | Status: DC
  Filled 2022-12-12: qty 6.7, 16d supply, fill #0

## 2022-12-13 ENCOUNTER — Telehealth: Payer: Self-pay | Admitting: Family Medicine

## 2022-12-13 ENCOUNTER — Telehealth: Payer: No Typology Code available for payment source | Admitting: Family Medicine

## 2022-12-13 ENCOUNTER — Other Ambulatory Visit (HOSPITAL_COMMUNITY): Payer: Self-pay

## 2022-12-13 DIAGNOSIS — R051 Acute cough: Secondary | ICD-10-CM

## 2022-12-13 DIAGNOSIS — U071 COVID-19: Secondary | ICD-10-CM | POA: Diagnosis not present

## 2022-12-13 MED ORDER — PROMETHAZINE-DM 6.25-15 MG/5ML PO SYRP
5.0000 mL | ORAL_SOLUTION | Freq: Four times a day (QID) | ORAL | 0 refills | Status: AC | PRN
  Filled 2022-12-13: qty 118, 6d supply, fill #0

## 2022-12-13 MED ORDER — PREDNISONE 20 MG PO TABS
20.0000 mg | ORAL_TABLET | Freq: Two times a day (BID) | ORAL | 0 refills | Status: AC
  Filled 2022-12-13: qty 10, 5d supply, fill #0

## 2022-12-13 NOTE — Patient Instructions (Signed)

## 2022-12-13 NOTE — Telephone Encounter (Signed)
ERRORR

## 2022-12-13 NOTE — Progress Notes (Signed)
Virtual Visit Consent   Lindsey Davis, you are scheduled for a virtual visit with a [facility] provider today. Just as with appointments in the office, your consent must be obtained to participate. Your consent will be active for this visit and any virtual visit you may have with one of our providers in the next 365 days. If you have a MyChart account, a copy of this consent can be sent to you electronically.  As this is a virtual visit, video technology does not allow for your provider to perform a traditional examination. This may limit your provider's ability to fully assess your condition. If your provider identifies any concerns that need to be evaluated in person or the need to arrange testing (such as labs, EKG, etc.), we will make arrangements to do so. Although advances in technology are sophisticated, we cannot ensure that it will always work on either your end or our end. If the connection with a video visit is poor, the visit may have to be switched to a telephone visit. With either a video or telephone visit, we are not always able to ensure that we have a secure connection.  By engaging in this virtual visit, you consent to the provision of healthcare and authorize for your insurance to be billed (if applicable) for the services provided during this visit. Depending on your insurance coverage, you may receive a charge related to this service.  I need to obtain your verbal consent now. Are you willing to proceed with your visit today? Lindsey Davis has provided verbal consent on 12/13/2022 for a virtual visit (video or telephone). Lindsey Curio, FNP  Date: 12/13/2022 7:53 PM  Virtual Visit via Video Note   I, Lindsey Davis, connected with  Lindsey Davis  ([id number], 19-Oct-1961) on 12/13/22 at  8:00 PM EST by a video-enabled telemedicine application and verified that I am speaking with the correct person using two identifiers.  Location: Patient: Virtual Visit Location Patient:  Home Provider: Virtual Visit Location Provider: Home Office   I discussed the limitations of evaluation and management by telemedicine and the availability of in person appointments. The patient expressed understanding and agreed to proceed.    History of Present Illness: Lindsey Davis is a 61 y.o. who identifies as a female who was assigned female at birth, and is being seen today for a cough persistent since she had covid 2 weeks ago. No fever, body aches or sob. She reports since she had covid the cough is constant and worse at night. Marland Kitchen  HPI: HPI  Problems:  Patient Active Problem List   Diagnosis Date Noted   COVID-19 11/26/2022   Age-related osteoporosis without current pathological fracture 03/04/2022   Osteoporosis 12/18/2021   Hypothyroidism 01/17/2016   Obesity 10/21/2014   Depression 03/28/2008   Attention deficit disorder 03/28/2008   Allergic rhinitis 02/09/2008   Asthma 02/09/2008    Allergies: No Known Allergies Medications:  Current Outpatient Medications:    predniSONE (DELTASONE) 20 MG tablet, Take 1 tablet (20 mg total) by mouth 2 (two) times daily with a meal for 5 days., Disp: 10 tablet, Rfl: 0   promethazine-dextromethorphan (PROMETHAZINE-DM) 6.25-15 MG/5ML syrup, Take 5 mLs by mouth 4 (four) times daily as needed for up to 10 days for cough., Disp: 118 mL, Rfl: 0   acetaminophen (TYLENOL) 500 MG tablet, Take 500 mg by mouth every 6 (six) hours as needed., Disp: , Rfl:    albuterol (VENTOLIN HFA) 108 (90 Base) MCG/ACT  inhaler, Inhale 2 puffs into the lungs every 6 (six) hours as needed for wheezing or shortness of breath (cough, shortness of breath or wheezing.)., Disp: 18 g, Rfl: 5   albuterol (VENTOLIN HFA) 108 (90 Base) MCG/ACT inhaler, Inhale into the lungs as directed., Disp: 6.7 g, Rfl: 5   alendronate (FOSAMAX) 70 MG tablet, Take 1 tablet (70 mg total) by mouth every 7 (seven) days. Take with a full glass of water on an empty stomach., Disp: 13 tablet, Rfl:  3   amphetamine-dextroamphetamine (ADDERALL XR) 30 MG 24 hr capsule, Take 30 mg by mouth daily., Disp: , Rfl:    amphetamine-dextroamphetamine (ADDERALL XR) 30 MG 24 hr capsule, Take 1 capsule by mouth every morning., Disp: 30 capsule, Rfl: 0   amphetamine-dextroamphetamine (ADDERALL XR) 30 MG 24 hr capsule, 1 P.O. QAM, Disp: 30 capsule, Rfl: 0   amphetamine-dextroamphetamine (ADDERALL XR) 30 MG 24 hr capsule, Take 1 capsule by mouth every morning., Disp: 30 capsule, Rfl: 0   amphetamine-dextroamphetamine (ADDERALL XR) 30 MG 24 hr capsule, Take 1 capsule (30 mg total) by mouth every morning. (11/27/22), Disp: 30 capsule, Rfl: 0   amphetamine-dextroamphetamine (ADDERALL) 20 MG tablet, Take 1 and 1/2 tablet by mouth once a day, Disp: 45 tablet, Rfl: 0   amphetamine-dextroamphetamine (ADDERALL) 20 MG tablet, Take 1 and 1/2 tablets by mouth daily., Disp: 45 tablet, Rfl: 0   amphetamine-dextroamphetamine (ADDERALL) 20 MG tablet, 1 1/2 PO QD, Disp: 45 tablet, Rfl: 0   amphetamine-dextroamphetamine (ADDERALL) 20 MG tablet, Take 1 and 1/2 tablets by mouth daily., Disp: 45 tablet, Rfl: 0   amphetamine-dextroamphetamine (ADDERALL) 20 MG tablet, Take 1 and 1/2 tablets by mouth daily * Do not fill until 06/14/22, Disp: 45 tablet, Rfl: 0   amphetamine-dextroamphetamine (ADDERALL) 20 MG tablet, Take 1 and 1/2 tablets by mouth daily * do not fill until 08/02, Disp: 45 tablet, Rfl: 0   amphetamine-dextroamphetamine (ADDERALL) 20 MG tablet, Take 1 & 1/2 tablets by mouth daily (fill 08/16/22), Disp: 45 tablet, Rfl: 0   amphetamine-dextroamphetamine (ADDERALL) 20 MG tablet, Take 1.5 tablets (30 mg total) by mouth daily., Disp: 45 tablet, Rfl: 0   amphetamine-dextroamphetamine (ADDERALL) 20 MG tablet, Take 1 and 1/2 tablets (30 mg total) by mouth daily. (11/27/22), Disp: 45 tablet, Rfl: 0   ARIPiprazole (ABILIFY) 2 MG tablet, Take 2 mg by mouth daily., Disp: , Rfl:    ARIPiprazole (ABILIFY) 2 MG tablet, Take 1 tablet (2  mg total) by mouth at bedtime., Disp: 90 tablet, Rfl: 0   benzonatate (TESSALON) 200 MG capsule, Take 1 capsule (200 mg total) by mouth 2 (two) times daily as needed for cough., Disp: 20 capsule, Rfl: 0   calcium carbonate (OS-CAL - DOSED IN MG OF ELEMENTAL CALCIUM) 1250 (500 Ca) MG tablet, Take 1 tablet by mouth., Disp: , Rfl:    Cholecalciferol (VITAMIN D3 PO), Take by mouth., Disp: , Rfl:    ferrous sulfate 324 MG TBEC, Take 324 mg by mouth. (Patient not taking: Reported on 09/02/2022), Disp: , Rfl:    fluticasone (FLONASE) 50 MCG/ACT nasal spray, Place 2 sprays into both nostrils daily., Disp: 16 g, Rfl: 6   levothyroxine (SYNTHROID) 50 MCG tablet, Take 50 mcg by mouth daily., Disp: , Rfl:    levothyroxine (SYNTHROID) 50 MCG tablet, Take 1 tablet (50 mcg total) by mouth every morning on an empty stomach and no food for 30 minutes, Disp: 390 tablet, Rfl: 0   Melatonin 10 MG TABS, Take by mouth., Disp: ,  Rfl:    Multiple Vitamin (MULTIVITAMIN) capsule, Take 1 capsule by mouth daily., Disp: , Rfl:    traZODone (DESYREL) 50 MG tablet, Take 2-3 tablets (100-150 mg total) by mouth at bedtime., Disp: 270 tablet, Rfl: 0   vortioxetine HBr (TRINTELLIX) 20 MG TABS tablet, Take 1 tablet by mouth daily., Disp: 90 tablet, Rfl: 0   vortioxetine HBr (TRINTELLIX) 20 MG TABS tablet, Take 1 tablet by mouth every day, Disp: 90 tablet, Rfl: 0  Observations/Objective: Patient is well-developed, well-nourished in no acute distress.  Resting comfortably  at home.  Head is normocephalic, atraumatic.  No labored breathing.  Speech is clear and coherent with logical content.  Patient is alert and oriented at baseline.    Assessment and Plan: 1. Acute cough  2. COVID  Increase fluids, humidifier at night, tylenol as directed, urgent care if sx persist or worsen.   Follow Up Instructions: I discussed the assessment and treatment plan with the patient. The patient was provided an opportunity to ask questions  and all were answered. The patient agreed with the plan and demonstrated an understanding of the instructions.  A copy of instructions were sent to the patient via MyChart unless otherwise noted below.     The patient was advised to call back or seek an in-person evaluation if the symptoms worsen or if the condition fails to improve as anticipated.  Time:  I spent 10 minutes with the patient via telehealth technology discussing the above problems/concerns.    Lindsey Curio, FNP

## 2022-12-14 ENCOUNTER — Other Ambulatory Visit (HOSPITAL_COMMUNITY): Payer: Self-pay

## 2022-12-22 ENCOUNTER — Other Ambulatory Visit (HOSPITAL_COMMUNITY): Payer: Self-pay

## 2022-12-23 ENCOUNTER — Other Ambulatory Visit (HOSPITAL_COMMUNITY): Payer: Self-pay

## 2022-12-23 MED ORDER — AMPHETAMINE-DEXTROAMPHETAMINE 20 MG PO TABS
ORAL_TABLET | ORAL | 0 refills | Status: DC
  Filled 2022-12-28: qty 45, 30d supply, fill #0

## 2022-12-23 MED ORDER — TRINTELLIX 20 MG PO TABS
20.0000 mg | ORAL_TABLET | Freq: Every day | ORAL | 1 refills | Status: DC
  Filled 2023-01-21 – 2023-03-01 (×2): qty 90, 90d supply, fill #0

## 2022-12-23 MED ORDER — AMPHETAMINE-DEXTROAMPHET ER 30 MG PO CP24
ORAL_CAPSULE | ORAL | 0 refills | Status: DC
  Filled 2022-12-30: qty 30, 30d supply, fill #0

## 2022-12-28 ENCOUNTER — Other Ambulatory Visit (HOSPITAL_COMMUNITY): Payer: Self-pay

## 2022-12-30 ENCOUNTER — Other Ambulatory Visit (HOSPITAL_COMMUNITY): Payer: Self-pay

## 2023-01-21 ENCOUNTER — Other Ambulatory Visit (HOSPITAL_COMMUNITY): Payer: Self-pay

## 2023-01-22 ENCOUNTER — Other Ambulatory Visit (HOSPITAL_COMMUNITY): Payer: Self-pay

## 2023-01-22 ENCOUNTER — Other Ambulatory Visit: Payer: Self-pay

## 2023-01-22 MED ORDER — TRAZODONE HCL 50 MG PO TABS
100.0000 mg | ORAL_TABLET | Freq: Every day | ORAL | 1 refills | Status: DC
  Filled 2023-01-22: qty 270, 90d supply, fill #0
  Filled 2023-05-03: qty 270, 90d supply, fill #1

## 2023-01-22 MED ORDER — ARIPIPRAZOLE 2 MG PO TABS
2.0000 mg | ORAL_TABLET | Freq: Every day | ORAL | 1 refills | Status: DC
  Filled 2023-01-22: qty 90, 90d supply, fill #0
  Filled 2023-05-03: qty 90, 90d supply, fill #1

## 2023-02-01 ENCOUNTER — Other Ambulatory Visit (HOSPITAL_COMMUNITY): Payer: Self-pay

## 2023-02-03 ENCOUNTER — Other Ambulatory Visit: Payer: Self-pay

## 2023-02-03 ENCOUNTER — Other Ambulatory Visit (HOSPITAL_COMMUNITY): Payer: Self-pay

## 2023-02-03 MED ORDER — AMPHETAMINE-DEXTROAMPHET ER 30 MG PO CP24
30.0000 mg | ORAL_CAPSULE | Freq: Every morning | ORAL | 0 refills | Status: DC
  Filled 2023-02-03 (×3): qty 30, 30d supply, fill #0

## 2023-02-03 MED ORDER — AMPHETAMINE-DEXTROAMPHETAMINE 20 MG PO TABS
30.0000 mg | ORAL_TABLET | Freq: Every day | ORAL | 0 refills | Status: DC
  Filled 2023-02-03 (×3): qty 45, 30d supply, fill #0

## 2023-02-04 ENCOUNTER — Other Ambulatory Visit: Payer: Self-pay

## 2023-02-16 ENCOUNTER — Other Ambulatory Visit (HOSPITAL_COMMUNITY): Payer: Self-pay

## 2023-02-17 ENCOUNTER — Other Ambulatory Visit (HOSPITAL_COMMUNITY): Payer: Self-pay

## 2023-02-18 ENCOUNTER — Other Ambulatory Visit (HOSPITAL_COMMUNITY): Payer: Self-pay

## 2023-03-01 ENCOUNTER — Other Ambulatory Visit (HOSPITAL_COMMUNITY): Payer: Self-pay

## 2023-03-03 ENCOUNTER — Other Ambulatory Visit: Payer: Self-pay

## 2023-03-03 ENCOUNTER — Other Ambulatory Visit (HOSPITAL_COMMUNITY): Payer: Self-pay

## 2023-03-03 MED ORDER — AMPHETAMINE-DEXTROAMPHETAMINE 20 MG PO TABS
30.0000 mg | ORAL_TABLET | Freq: Every day | ORAL | 0 refills | Status: DC
  Filled 2023-03-04: qty 45, 30d supply, fill #0

## 2023-03-03 MED ORDER — AMPHETAMINE-DEXTROAMPHET ER 30 MG PO CP24
30.0000 mg | ORAL_CAPSULE | Freq: Every morning | ORAL | 0 refills | Status: DC
  Filled 2023-03-04: qty 30, 30d supply, fill #0

## 2023-03-04 ENCOUNTER — Other Ambulatory Visit (HOSPITAL_COMMUNITY): Payer: Self-pay

## 2023-03-05 ENCOUNTER — Other Ambulatory Visit (HOSPITAL_COMMUNITY): Payer: Self-pay

## 2023-03-20 ENCOUNTER — Other Ambulatory Visit (HOSPITAL_COMMUNITY): Payer: Self-pay

## 2023-03-20 ENCOUNTER — Other Ambulatory Visit: Payer: Self-pay

## 2023-03-20 ENCOUNTER — Other Ambulatory Visit: Payer: Self-pay | Admitting: Family Medicine

## 2023-03-20 ENCOUNTER — Other Ambulatory Visit: Payer: Self-pay | Admitting: Internal Medicine

## 2023-03-20 DIAGNOSIS — Z1231 Encounter for screening mammogram for malignant neoplasm of breast: Secondary | ICD-10-CM

## 2023-03-20 MED ORDER — LEVOTHYROXINE SODIUM 50 MCG PO TABS
50.0000 ug | ORAL_TABLET | Freq: Every day | ORAL | 3 refills | Status: DC
  Filled 2023-03-20 – 2023-04-02 (×2): qty 90, 90d supply, fill #0
  Filled 2023-06-26: qty 90, 90d supply, fill #1
  Filled 2023-10-04: qty 90, 90d supply, fill #2
  Filled 2024-01-03: qty 90, 90d supply, fill #3

## 2023-03-21 ENCOUNTER — Other Ambulatory Visit (HOSPITAL_COMMUNITY): Payer: Self-pay

## 2023-03-21 MED ORDER — ALENDRONATE SODIUM 70 MG PO TABS
70.0000 mg | ORAL_TABLET | ORAL | 3 refills | Status: DC
  Filled 2023-03-21 – 2023-04-02 (×2): qty 12, 84d supply, fill #0
  Filled 2023-06-14: qty 12, 84d supply, fill #1

## 2023-03-31 ENCOUNTER — Other Ambulatory Visit (HOSPITAL_COMMUNITY): Payer: Self-pay

## 2023-04-02 ENCOUNTER — Other Ambulatory Visit (HOSPITAL_COMMUNITY): Payer: Self-pay

## 2023-04-03 ENCOUNTER — Ambulatory Visit (INDEPENDENT_AMBULATORY_CARE_PROVIDER_SITE_OTHER): Payer: 59 | Admitting: Family Medicine

## 2023-04-03 ENCOUNTER — Other Ambulatory Visit (HOSPITAL_COMMUNITY)
Admission: RE | Admit: 2023-04-03 | Discharge: 2023-04-03 | Disposition: A | Discharge status: Home / Self Care | Payer: 59 | Source: Ambulatory Visit | Attending: Family Medicine | Admitting: Family Medicine

## 2023-04-03 ENCOUNTER — Encounter: Payer: Self-pay | Admitting: Family Medicine

## 2023-04-03 ENCOUNTER — Other Ambulatory Visit (HOSPITAL_COMMUNITY): Payer: Self-pay

## 2023-04-03 VITALS — BP 130/84 | HR 64 | Temp 98.1°F | Ht 64.5 in | Wt 180.4 lb

## 2023-04-03 DIAGNOSIS — Z23 Encounter for immunization: Secondary | ICD-10-CM | POA: Diagnosis not present

## 2023-04-03 DIAGNOSIS — E039 Hypothyroidism, unspecified: Secondary | ICD-10-CM | POA: Diagnosis not present

## 2023-04-03 DIAGNOSIS — Z124 Encounter for screening for malignant neoplasm of cervix: Secondary | ICD-10-CM

## 2023-04-03 DIAGNOSIS — Z0000 Encounter for general adult medical examination without abnormal findings: Secondary | ICD-10-CM

## 2023-04-03 DIAGNOSIS — M810 Age-related osteoporosis without current pathological fracture: Secondary | ICD-10-CM

## 2023-04-03 DIAGNOSIS — F3342 Major depressive disorder, recurrent, in full remission: Secondary | ICD-10-CM

## 2023-04-03 DIAGNOSIS — Z87891 Personal history of nicotine dependence: Secondary | ICD-10-CM | POA: Diagnosis not present

## 2023-04-03 DIAGNOSIS — E785 Hyperlipidemia, unspecified: Secondary | ICD-10-CM

## 2023-04-03 LAB — TSH: TSH: 1.7 u[IU]/mL (ref 0.35–5.50)

## 2023-04-03 LAB — CBC WITH DIFFERENTIAL/PLATELET
Basophils Absolute: 0 10*3/uL (ref 0.0–0.1)
Basophils Relative: 0.5 % (ref 0.0–3.0)
Eosinophils Absolute: 0 10*3/uL (ref 0.0–0.7)
Eosinophils Relative: 0.5 % (ref 0.0–5.0)
HCT: 42.3 % (ref 36.0–46.0)
Hemoglobin: 14 g/dL (ref 12.0–15.0)
Lymphocytes Relative: 24.8 % (ref 12.0–46.0)
Lymphs Abs: 1.3 10*3/uL (ref 0.7–4.0)
MCHC: 33 g/dL (ref 30.0–36.0)
MCV: 91.7 fl (ref 78.0–100.0)
Monocytes Absolute: 0.3 10*3/uL (ref 0.1–1.0)
Monocytes Relative: 6 % (ref 3.0–12.0)
Neutro Abs: 3.5 10*3/uL (ref 1.4–7.7)
Neutrophils Relative %: 68.2 % (ref 43.0–77.0)
Platelets: 222 10*3/uL (ref 150.0–400.0)
RBC: 4.62 Mil/uL (ref 3.87–5.11)
RDW: 13.1 % (ref 11.5–15.5)
WBC: 5.1 10*3/uL (ref 4.0–10.5)

## 2023-04-03 LAB — URINALYSIS, ROUTINE W REFLEX MICROSCOPIC
Bilirubin Urine: NEGATIVE
Hgb urine dipstick: NEGATIVE
Ketones, ur: NEGATIVE
Leukocytes,Ua: NEGATIVE
Nitrite: NEGATIVE
RBC / HPF: NONE SEEN (ref 0–?)
Specific Gravity, Urine: 1.02 (ref 1.000–1.030)
Total Protein, Urine: NEGATIVE
Urine Glucose: NEGATIVE
Urobilinogen, UA: 0.2 (ref 0.0–1.0)
pH: 6.5 (ref 5.0–8.0)

## 2023-04-03 LAB — COMPREHENSIVE METABOLIC PANEL
ALT: 21 U/L (ref 0–35)
AST: 18 U/L (ref 0–37)
Albumin: 4.5 g/dL (ref 3.5–5.2)
Alkaline Phosphatase: 35 U/L — ABNORMAL LOW (ref 39–117)
BUN: 17 mg/dL (ref 6–23)
CO2: 30 mEq/L (ref 19–32)
Calcium: 9.9 mg/dL (ref 8.4–10.5)
Chloride: 101 mEq/L (ref 96–112)
Creatinine, Ser: 0.64 mg/dL (ref 0.40–1.20)
GFR: 94.65 mL/min (ref >60.00)
Glucose, Bld: 93 mg/dL (ref 70–99)
Potassium: 4.4 mEq/L (ref 3.5–5.1)
Sodium: 139 mEq/L (ref 135–145)
Total Bilirubin: 0.4 mg/dL (ref 0.2–1.2)
Total Protein: 6.7 g/dL (ref 6.0–8.3)

## 2023-04-03 LAB — LIPID PANEL
Cholesterol: 225 mg/dL — ABNORMAL HIGH (ref 0–200)
HDL: 66.1 mg/dL (ref >39.00)
LDL Cholesterol: 139 mg/dL — ABNORMAL HIGH (ref 0–99)
NonHDL: 158.96
Total CHOL/HDL Ratio: 3
Triglycerides: 100 mg/dL (ref 0.0–149.0)
VLDL: 20 mg/dL (ref 0.0–40.0)

## 2023-04-03 LAB — VITAMIN D 25 HYDROXY (VIT D DEFICIENCY, FRACTURES): VITD: 51.16 ng/mL (ref 30.00–100.00)

## 2023-04-03 MED ORDER — AMPHETAMINE-DEXTROAMPHETAMINE 20 MG PO TABS
30.0000 mg | ORAL_TABLET | Freq: Every day | ORAL | 0 refills | Status: DC
  Filled 2023-04-03: qty 45, 30d supply, fill #0

## 2023-04-03 MED ORDER — AMPHETAMINE-DEXTROAMPHET ER 30 MG PO CP24
30.0000 mg | ORAL_CAPSULE | Freq: Every morning | ORAL | 0 refills | Status: DC
  Filled 2023-04-03: qty 30, 30d supply, fill #0

## 2023-04-03 NOTE — Patient Instructions (Addendum)
Pap today- doing HPV cotesting so should be last pap if normal  Tetanus, Diphtheria, and Pertussis (Tdap) today  Please stop by lab before you go If you have mychart- we will send your results within 3 business days of [place] receiving them.  If you do not have mychart- we will call you about results within 5 business days of [place] receiving them.  *please also note that you will see labs on mychart as soon as they post. I will later go in and write notes on them- will say "notes from Dr. Durene Cal"   Recommended follow up: Return in about 1 year (around 04/02/2024) for physical or sooner if needed.Schedule b4 you leave.

## 2023-04-03 NOTE — Progress Notes (Signed)
Phone [phone]   Subjective:  Patient presents today for their annual physical. Chief complaint-noted.   See problem oriented charting- ROS- full  review of systems was completed and negative except for: trouble losing weight but looking back down 7 lbs from december  The following were reviewed and entered/updated in epic: Past Medical History:  Diagnosis Date   Allergy    Anemia    Asthma    Fibrocystic breast    Iron deficiency anemia 09/20/2010   Resolved post menopause.     Patient Active Problem List   Diagnosis Date Noted   Age-related osteoporosis without current pathological fracture 03/04/2022    Priority: Medium    Hypothyroidism 01/17/2016    Priority: Medium    Obesity 10/21/2014    Priority: Medium    Depression 03/28/2008    Priority: Medium    Attention deficit disorder 03/28/2008    Priority: Medium    Asthma 02/09/2008    Priority: Medium    Allergic rhinitis 02/09/2008    Priority: Low   Past Surgical History:  Procedure Laterality Date   COLONOSCOPY     WRIST SURGERY      Family History  Problem Relation Age of Onset   Breast cancer Mother        27   Urinary tract infection Mother        chronic   Heart attack Father        8   Breast cancer Sister        early 76s   Cancer Paternal Grandmother    Colon cancer Neg Hx    Colon polyps Neg Hx    Esophageal cancer Neg Hx    Rectal cancer Neg Hx    Stomach cancer Neg Hx     Medications- reviewed and updated Current Outpatient Medications  Medication Sig Dispense Refill   acetaminophen (TYLENOL) 500 MG tablet Take 500 mg by mouth every 6 (six) hours as needed.     albuterol (VENTOLIN HFA) 108 (90 Base) MCG/ACT inhaler Inhale into the lungs as directed. 6.7 g 5   alendronate (FOSAMAX) 70 MG tablet Take 1 tablet (70 mg total) by mouth every 7 (seven) days. Take with a full glass of water on an empty stomach. 13 tablet 3   amphetamine-dextroamphetamine (ADDERALL XR) 30 MG 24 hr  capsule Take 1 capsule by mouth every morning. 30 capsule 0   amphetamine-dextroamphetamine (ADDERALL) 20 MG tablet Take 1.5 tablets (30 mg total) by mouth daily. 45 tablet 0   ARIPiprazole (ABILIFY) 2 MG tablet Take 1 tablet (2 mg total) by mouth at bedtime. 90 tablet 1   calcium carbonate (OS-CAL - DOSED IN MG OF ELEMENTAL CALCIUM) 1250 (500 Ca) MG tablet Take 1 tablet by mouth.     Cholecalciferol (VITAMIN D3 PO) Take by mouth.     ferrous sulfate 324 MG TBEC Take 324 mg by mouth.     fluticasone (FLONASE) 50 MCG/ACT nasal spray Place 2 sprays into both nostrils daily. 16 g 6   levothyroxine (SYNTHROID) 50 MCG tablet Take 1 tablet (50 mcg total) by mouth daily. 90 tablet 3   Melatonin 10 MG TABS Take by mouth.     Multiple Vitamin (MULTIVITAMIN) capsule Take 1 capsule by mouth daily.     traZODone (DESYREL) 50 MG tablet Take 2-3 tablets (100-150 mg total) by mouth at bedtime. 270 tablet 1   vortioxetine HBr (TRINTELLIX) 20 MG TABS tablet Take 1 tablet by mouth daily. 90 tablet 0  No current facility-administered medications for this visit.    Allergies-reviewed and updated No Known Allergies  Social History   Social History Narrative   Married 1990. 3 kids (1 set of twins 18 in 2017, oldest 69- started nursing school).       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      Hobbies: enjoys exercise   Objective  Objective:  BP 130/84 (BP Location: Left Arm, Patient Position: Sitting)   Pulse 64   Temp 98.1 F (36.7 C) (Temporal)   Ht 5' 4.5" (1.638 m)   Wt 180 lb 6.4 oz (81.8 kg)   LMP 03/31/2011   SpO2 98%   BMI 30.49 kg/m  Gen: NAD, resting comfortably HEENT: Mucous membranes are moist. Oropharynx normal Neck: no thyromegaly CV: RRR no murmurs rubs or gallops Lungs: CTAB no crackles, wheeze, rhonchi Abdomen:  soft/nontender/nondistended/normal bowel sounds. No rebound or guarding.  Ext: no edema Skin: warm, dry Neuro: grossly normal, moves all extremities, PERRLA Pelvic: cervix normal in appearance, external genitalia normal, no adnexal masses or tenderness, no cervical motion tenderness, uterus normal size, shape, and consistency, and vagina normal without discharge     Assessment and Plan   62 y.o. female presenting for annual physical.  Health Maintenance counseling: 1. Anticipatory guidance: Patient counseled regarding regular dental exams -q6 months, eye exams -yearly,  avoiding smoking and second hand smoke , limiting alcohol to 1 beverage per day, no illicit drugs .   2. Risk factor reduction:  Advised patient of need for regular exercise and diet rich and fruits and vegetables to reduce risk of heart attack and stroke.  Exercise- started running in last month or two or treadmill most nights even when she doesn't feel like it- mots day sof week.  Diet/weight management-down from December visit by 7 lbs  - congratulated on efforts Wt Readings from Last 3 Encounters:  04/03/23 180 lb 6.4 oz (81.8 kg)  11/26/22 187 lb (84.8 kg)  09/02/22 178 lb (80.7 kg)  3. Immunizations/screenings/ancillary studies-had COVID-19 about 4 months ago-advised her to wait until the fall for next COVID vaccination , Tetanus, Diphtheria, and Pertussis (Tdap) today   Immunization History  Administered Date(s) Administered   Influenza Whole 09/10/2008   Influenza,inj,Quad PF,6+ Mos 10/20/2017, 11/26/2018, 08/17/2019, 09/29/2021   Influenza-Unspecified 08/16/2014, 11/01/2015, 10/16/2020   PFIZER(Purple Top)SARS-COV-2 Vaccination 12/27/2019, 01/17/2020, 11/08/2020, 06/30/2021   Tdap 03/25/2013   Zoster Recombinat (Shingrix) 08/17/2019, 11/17/2019  4. Cervical cancer screening-pap smear 08/17/19 with 3 year repeat planned was normal. Planned 2023 with our office but not seen at that time- will do today .  No discharge  or blood noted 5. Breast cancer screening-  breast exam- declined today-and mammogram 10/25/21 with 1 year repeat planned and scheduled 05/02/23 . Also does self exams 6. Colon cancer screening -  colonoscopy 02/22/21 with 10 year repeat planned-   7. Skin cancer screening-  no dermatologist . advised regular sunscreen use. Denies worrisome, changing, or new skin lesions.  8. Birth control/STD check- postmenopausal and monogamous   9. Osteoporosis screening at 64-followed by endocrinology  -Former smoker- 10 pack years but quit in 2000s. urinalysis today  Status of chronic or acute concerns   #hypothyroidism S: compliant On thyroid medication-levothyroxine 50 mcg Lab Results  Component Value Date   TSH 2.21 03/04/2022  A/P:hopefully stable- update TSH  today. Continue current meds for now   # Asthma S: Maintenance Medication: none As needed medication: Albuterol. Patient is using this almost never  A/P: stable- continue current medicines    # Adult ADD-on Adderall 20 mg through Dr. Evelene Croon and takes XR as well through psychiatry coordination # Depression S: Medication:Abilify 2 mg, Trintellix 20 mg, trazodone 50 mg for sleep A/P: reasonable control continue current medications    # Osteoporosis-follows with Dr. Lonzo Cloud S: Last DEXA: 12/17/21 with lumbar spine -3.6 Medication (bisphosphonate or prolia):  fosamax recommended 12/18/21 - started mid 2023 - still on calcium and vitmain D A/P: suspected stable  -Hypercalciuria followed by endocrinology as well- they may do a 24 hour urinary calcium in future   #hyperlipidemia S: Medication:none  Lab Results  Component Value Date   CHOL 217 (H) 11/23/2021   HDL 59 11/23/2021   LDLCALC 134 (H) 11/23/2021   TRIG 125 11/23/2021   CHOLHDL 3.7 11/23/2021   A/P: update lipids- if risk above 5% with family history consider CT calcium scoring with dads history  The 10-year ASCVD risk score (Arnett DK, et al., 2019) is: 4.2%   Recommended  follow up: Return in about 1 year (around 04/02/2024) for physical or sooner if needed.Schedule b4 you leave. Future Appointments  Date Time Provider Department Center  05/02/2023  8:00 AM [facility] 3 [facility]  09/03/2023  8:30 AM Shamleffer, Konrad Dolores, MD [facility] None   Lab/Order associations:NOT fasting- boiled eggs   ICD-10-CM   1. Preventative health care  Z00.00 TSH    CBC with Differential/Platelet    Comprehensive metabolic panel    Lipid panel    VITAMIN D 25 Hydroxy (Vit-D Deficiency, Fractures)    Urinalysis, Routine w reflex microscopic    2. Recurrent major depressive disorder, in full remission  F33.42     3. Hypothyroidism, unspecified type  E03.9 TSH    4. Mild hyperlipidemia  E78.5 CBC with Differential/Platelet    Comprehensive metabolic panel    Lipid panel    Urinalysis, Routine w reflex microscopic    5. Age-related osteoporosis without current pathological fracture  M81.0 VITAMIN D 25 Hydroxy (Vit-D Deficiency, Fractures)    6. Former smoker  Z87.891 Urinalysis, Routine w reflex microscopic    7. Screening for cervical cancer  Z12.4 Cytology - PAP( [place])      No orders of the defined types were placed in this encounter.   Return precautions advised.  Tana Conch, MD

## 2023-04-05 ENCOUNTER — Other Ambulatory Visit (HOSPITAL_COMMUNITY): Payer: Self-pay

## 2023-04-08 LAB — CYTOLOGY - PAP
Comment: NEGATIVE
Diagnosis: NEGATIVE
High risk HPV: NEGATIVE

## 2023-05-02 ENCOUNTER — Ambulatory Visit
Admission: RE | Admit: 2023-05-02 | Discharge: 2023-05-02 | Disposition: A | Discharge status: Home / Self Care | Payer: 59 | Source: Ambulatory Visit | Attending: Family Medicine | Admitting: Family Medicine

## 2023-05-02 DIAGNOSIS — Z1231 Encounter for screening mammogram for malignant neoplasm of breast: Secondary | ICD-10-CM

## 2023-05-03 ENCOUNTER — Other Ambulatory Visit (HOSPITAL_COMMUNITY): Payer: Self-pay

## 2023-05-05 ENCOUNTER — Other Ambulatory Visit (HOSPITAL_COMMUNITY): Payer: Self-pay

## 2023-05-05 MED ORDER — AMPHETAMINE-DEXTROAMPHET ER 30 MG PO CP24
30.0000 mg | ORAL_CAPSULE | Freq: Every morning | ORAL | 0 refills | Status: DC
  Filled 2023-05-05: qty 30, 30d supply, fill #0

## 2023-05-05 MED ORDER — AMPHETAMINE-DEXTROAMPHETAMINE 20 MG PO TABS
30.0000 mg | ORAL_TABLET | Freq: Every day | ORAL | 0 refills | Status: DC
  Filled 2023-05-05: qty 45, 30d supply, fill #0

## 2023-06-06 ENCOUNTER — Other Ambulatory Visit (HOSPITAL_COMMUNITY): Payer: Self-pay

## 2023-06-06 MED ORDER — AMPHETAMINE-DEXTROAMPHETAMINE 20 MG PO TABS
30.0000 mg | ORAL_TABLET | Freq: Every day | ORAL | 0 refills | Status: DC
  Filled 2023-06-06: qty 45, 30d supply, fill #0

## 2023-06-06 MED ORDER — AMPHETAMINE-DEXTROAMPHET ER 30 MG PO CP24
30.0000 mg | ORAL_CAPSULE | Freq: Every morning | ORAL | 0 refills | Status: DC
  Filled 2023-06-06: qty 30, 30d supply, fill #0

## 2023-06-13 ENCOUNTER — Other Ambulatory Visit (HOSPITAL_COMMUNITY): Payer: Self-pay

## 2023-06-13 MED ORDER — TRINTELLIX 20 MG PO TABS
20.0000 mg | ORAL_TABLET | Freq: Every day | ORAL | 1 refills | Status: DC
  Filled 2023-06-13: qty 90, 90d supply, fill #0

## 2023-06-14 ENCOUNTER — Other Ambulatory Visit (HOSPITAL_COMMUNITY): Payer: Self-pay

## 2023-06-17 ENCOUNTER — Other Ambulatory Visit (HOSPITAL_BASED_OUTPATIENT_CLINIC_OR_DEPARTMENT_OTHER): Payer: Self-pay

## 2023-06-25 ENCOUNTER — Other Ambulatory Visit: Payer: Self-pay | Admitting: Oncology

## 2023-06-25 DIAGNOSIS — Z006 Encounter for examination for normal comparison and control in clinical research program: Secondary | ICD-10-CM

## 2023-06-26 ENCOUNTER — Other Ambulatory Visit
Admission: RE | Admit: 2023-06-26 | Discharge: 2023-06-26 | Disposition: A | Discharge status: Home / Self Care | Payer: 59 | Attending: Oncology | Admitting: Oncology

## 2023-06-26 DIAGNOSIS — Z006 Encounter for examination for normal comparison and control in clinical research program: Secondary | ICD-10-CM

## 2023-06-30 ENCOUNTER — Other Ambulatory Visit (HOSPITAL_COMMUNITY): Payer: Self-pay

## 2023-07-04 ENCOUNTER — Other Ambulatory Visit (HOSPITAL_COMMUNITY): Payer: Self-pay

## 2023-07-04 MED ORDER — AMPHETAMINE-DEXTROAMPHETAMINE 20 MG PO TABS
30.0000 mg | ORAL_TABLET | Freq: Every day | ORAL | 0 refills | Status: DC
  Filled 2023-07-07: qty 45, 30d supply, fill #0

## 2023-07-04 MED ORDER — AMPHETAMINE-DEXTROAMPHET ER 30 MG PO CP24
30.0000 mg | ORAL_CAPSULE | Freq: Every morning | ORAL | 0 refills | Status: DC
  Filled 2023-07-07: qty 30, 30d supply, fill #0

## 2023-07-05 ENCOUNTER — Other Ambulatory Visit (HOSPITAL_COMMUNITY): Payer: Self-pay

## 2023-07-05 ENCOUNTER — Other Ambulatory Visit: Payer: Self-pay

## 2023-07-07 ENCOUNTER — Other Ambulatory Visit: Payer: Self-pay

## 2023-07-07 ENCOUNTER — Other Ambulatory Visit (HOSPITAL_COMMUNITY): Payer: Self-pay

## 2023-07-10 DIAGNOSIS — F900 Attention-deficit hyperactivity disorder, predominantly inattentive type: Secondary | ICD-10-CM | POA: Diagnosis not present

## 2023-07-10 DIAGNOSIS — F3342 Major depressive disorder, recurrent, in full remission: Secondary | ICD-10-CM | POA: Diagnosis not present

## 2023-07-15 ENCOUNTER — Telehealth (INDEPENDENT_AMBULATORY_CARE_PROVIDER_SITE_OTHER): Payer: 59 | Admitting: Family Medicine

## 2023-07-15 ENCOUNTER — Encounter: Payer: Self-pay | Admitting: Family Medicine

## 2023-07-15 VITALS — Ht 64.5 in | Wt 186.0 lb

## 2023-07-15 DIAGNOSIS — U071 COVID-19: Secondary | ICD-10-CM

## 2023-07-15 MED ORDER — BENZONATATE 100 MG PO CAPS: ORAL_CAPSULE | ORAL | 0 refills | Status: DC

## 2023-07-15 MED ORDER — MOLNUPIRAVIR EUA 200MG CAPSULE: 4.0000 | ORAL_CAPSULE | Freq: Two times a day (BID) | ORAL | 0 refills | Status: AC

## 2023-07-15 NOTE — Patient Instructions (Addendum)
HOME CARE TIPS:  -COVID19 testing information:  -I sent the medication(s) we discussed to your pharmacy: Meds ordered this encounter  Medications   molnupiravir EUA (LAGEVRIO) 200 mg CAPS capsule    Sig: Take 4 capsules (800 mg total) by mouth 2 (two) times daily for 5 days.    Dispense:  40 capsule    Refill:  0   benzonatate (TESSALON PERLES) 100 MG capsule    Sig: 1-2 capsules up to twice daily as needed for cough.    Dispense:  30 capsule    Refill:  0     -I sent in the Covid19 treatment or referral you requested per our discussion. Please see the information provided below and discuss further with the pharmacist/treatment team.    -there is a chance of rebound illness with covid after improving. This can happen whether or not you take an antiviral treatment. If you become sick again with covid after getting better, please schedule a follow up virtual visit and isolate again.  -can use tylenol if needed for fevers, aches and pains per instructions  -nasal saline sinus rinses twice daily  -stay hydrated, drink plenty of fluids and eat small healthy meals - avoid dairy  -can take 1000 IU ( ) Vit D3 and 100-500 mg of Vit C daily per instructions  -follow up with your doctor in 2-3 days unless improving and feeling better  -stay home while sick, except to seek medical care. If you have COVID19, you may likely be contagious for 7-10 days. Flu or Influenza is likely contagious for about 7 days. Other respiratory viral infections remain contagious for 5-10+ days depending on the virus and many other factors. Wear a good mask that fits snugly (such as N95 or KN95) if around others to reduce the risk of transmission.  I really hope you are feeling better soon. I help [facility] out with telemedicine visits on Tuesdays and Thursdays and am happy to help if you need a follow up virtual visit on those days. Otherwise, if you have any concerns or questions following this visit please  schedule a follow up visit with your Primary Care doctor or seek care at a local urgent care clinic to avoid delays in care.    Seek in person care or schedule a follow up video visit promptly if your symptoms worsen, new concerns arise or you are not improving with treatment. Call 911 and/or seek emergency care if your symptoms are severe or life threatening.   PLEASE SEE THE FOLLOWING LINK FOR THE MOST UPDATED INFORMATION ABOUT LAGEVRIO:  [email or web address]      Fact Sheet for Patients And Caregivers Emergency Use Authorization (EUA) Of LAGEVRIOT (molnupiravir) capsules For Coronavirus Disease 2019 (COVID-19)  What is the most important information I should know about LAGEVRIO? LAGEVRIO may cause serious side effects, including: ? LAGEVRIO may cause harm to your unborn baby. It is not known if LAGEVRIO will harm your baby if you take LAGEVRIO during pregnancy. o LAGEVRIO is not recommended for use in pregnancy. o LAGEVRIO has not been studied in pregnancy. LAGEVRIO was studied in pregnant animals only. When LAGEVRIO was given to pregnant animals, LAGEVRIO caused harm to their unborn babies. o You and your healthcare provider may decide that you should take LAGEVRIO during pregnancy if there are no other COVID-19 treatment options approved or authorized by the FDA that are accessible or clinically appropriate for you. o If you and your healthcare provider decide that you should take LAGEVRIO during pregnancy,  you and your healthcare provider should discuss the known and potential benefits and the potential risks of taking LAGEVRIO during pregnancy. For individuals who are able to become pregnant: ? You should use a reliable method of birth control (contraception) consistently and correctly during treatment with LAGEVRIO and for 4 days after the last dose of LAGEVRIO. Talk to your healthcare provider about reliable birth control methods. ? Before starting treatment with  [facility] your healthcare provider may do a pregnancy test to see if you are pregnant before starting treatment with LAGEVRIO. ? Tell your healthcare provider right away if you become pregnant or think you may be pregnant during treatment with LAGEVRIO. Pregnancy Surveillance Program: ? There is a pregnancy surveillance program for individuals who take LAGEVRIO during pregnancy. The purpose of this program is to collect information about the health of you and your baby. Talk to your healthcare provider about how to take part in this program. ? If you take LAGEVRIO during pregnancy and you agree to participate in the pregnancy surveillance program and allow your healthcare provider to share your information with [employer], then your healthcare provider will report your use of LAGEVRIO during pregnancy to [employer] by calling [phone] or RelayImage.ca. For individuals who are sexually active with partners who are able to become pregnant: ? It is not known if LAGEVRIO can affect sperm. While the risk is regarded as low, animal studies to fully assess the potential for LAGEVRIO to affect the babies of males treated with LAGEVRIO have not been completed. A reliable method of birth control (contraception) should be used consistently and correctly during treatment with LAGEVRIO and for at least 3 months after the last dose. The risk to sperm beyond 3 months is not known. Studies to understand the risk to sperm beyond 3 months are ongoing. Talk to your healthcare provider about reliable birth control methods. Talk to your healthcare provider if you have questions or concerns about how LAGEVRIO may affect sperm. You are being given this fact sheet because your healthcare provider believes it is necessary to provide you with LAGEVRIO for the treatment of adults with mild-to-moderate coronavirus disease 2019 (COVID-19) with positive results of direct  SARS-CoV-2 viral testing, and who are at high risk for progression to severe COVID-19 including hospitalization or death, and for whom other COVID-19 treatment options approved or authorized by the FDA are not accessible or clinically appropriate. The U.S. Food and Drug Administration (FDA) has issued an Emergency Use Authorization (EUA) to make LAGEVRIO available during the COVID-19 pandemic (for more details about an EUA please see "What is an Emergency Use Authorization?" at the end of this document). LAGEVRIO is not an FDA-approved medicine in the [place]. Read this Fact Sheet for information about LAGEVRIO. Talk to your healthcare provider about your options if you have any questions. It is your choice to take LAGEVRIO.  What is COVID-19? COVID-19 is caused by a virus called a coronavirus. You can get COVID-19 through close contact with another person who has the virus. COVID-19 illnesses have ranged from very mild-to-severe, including illness resulting in death. While information so far suggests that most COVID-19 illness is mild, serious illness can happen and may cause some of your other medical conditions to become worse. Older people and people of all ages with severe, long lasting (chronic) medical conditions like heart disease, lung disease and diabetes, for example seem to be at higher risk of being hospitalized for COVID-19.  What  is LAGEVRIO? LAGEVRIO is an investigational medicine used to treat mild-to-moderate COVID-19 in adults: ? with positive results of direct SARS-CoV-2 viral testing, and ? who are at high risk for progression to severe COVID-19 including hospitalization or death, and for whom other COVID-19 treatment options approved or authorized by the FDA are not accessible or clinically appropriate. The FDA has authorized the emergency use of LAGEVRIO for the treatment of mild-tomoderate COVID-19 in adults under an EUA. For more information on EUA, see the  "What is an Emergency Use Authorization (EUA)?" section at the end of this Fact Sheet. LAGEVRIO is not authorized: ? for use in people less than 37 years of age. ? for prevention of COVID-19. ? for people needing hospitalization for COVID-19. ? for use for longer than 5 consecutive days.  What should I tell my healthcare provider before I take LAGEVRIO? Tell your healthcare provider if you: ? Have any allergies ? Are breastfeeding or plan to breastfeed ? Have any serious illnesses ? Are taking any medicines (prescription, over-the-counter, vitamins, or herbal products).  How do I take LAGEVRIO? ? Take LAGEVRIO exactly as your healthcare provider tells you to take it. ? Take 4 capsules of LAGEVRIO every 12 hours (for example, at 8 am and at 8 pm) ? Take LAGEVRIO for 5 days. It is important that you complete the full 5 days of treatment with LAGEVRIO. Do not stop taking LAGEVRIO before you complete the full 5 days of treatment, even if you feel better. ? Take LAGEVRIO with or without food. ? You should stay in isolation for as long as your healthcare provider tells you to. Talk to your healthcare provider if you are not sure about how to properly isolate while you have COVID-19. ? Swallow LAGEVRIO capsules whole. Do not open, break, or crush the capsules. If you cannot swallow capsules whole, tell your healthcare provider. ? What to do if you miss a dose: o If it has been less than 10 hours since the missed dose, take it as soon as you remember o If it has been more than 10 hours since the missed dose, skip the missed dose and take your dose at the next scheduled time. ? Do not double the dose of LAGEVRIO to make up for a missed dose.  What are the important possible side effects of LAGEVRIO? ? See, "What is the most important information I should know about LAGEVRIO?" ? Allergic Reactions. Allergic reactions can happen in people taking LAGEVRIO, even after only 1 dose. Stop taking  LAGEVRIO and call your healthcare provider right away if you get any of the following symptoms of an allergic reaction: o hives o rapid heartbeat o trouble swallowing or breathing o swelling of the mouth, lips, or face o throat tightness o hoarseness o skin rash The most common side effects of LAGEVRIO are: ? diarrhea ? nausea ? dizziness These are not all the possible side effects of LAGEVRIO. Not many people have taken LAGEVRIO. Serious and unexpected side effects may happen. This medicine is still being studied, so it is possible that all of the risks are not known at this time.  What other treatment choices are there?  Veklury (remdesivir) is FDA-approved as an intravenous (IV) infusion for the treatment of mildto-moderate COVID-19 in certain adults and children. Talk with your doctor to see if Elias Else is appropriate for you. Like LAGEVRIO, FDA may also allow for the emergency use of other medicines to treat people with COVID-19. Go to http://www.austin-beck.biz/  for more information. It is your choice to be treated or not to be treated with LAGEVRIO. Should you decide not to take it, it will not change your standard medical care.  What if I am breastfeeding? Breastfeeding is not recommended during treatment with LAGEVRIO and for 4 days after the last dose of LAGEVRIO. If you are breastfeeding or plan to breastfeed, talk to your healthcare provider about your options and specific situation before taking LAGEVRIO.  How do I report side effects with LAGEVRIO? Contact your healthcare provider if you have any side effects that bother you or do not go away. Report side effects to FDA MedWatch at MacRetreat.be or call 1-800-FDA-1088 (812-694-4715).  How should I store LAGEVRIO? ? Store LAGEVRIO capsules at room temperature between 61F to 50F (20C to 25C). ? Keep LAGEVRIO  and all medicines out of the reach of children and pets. How can I learn more about COVID-19? ? Ask your healthcare provider. ? Visit IndexCrawler.co.za ? Contact your local or state public health department. ? Call [employer] at [phone] (toll free in the [place] ? Visit [email or web address].com  What Is an Emergency Use Authorization (EUA)? The [place] FDA has made LAGEVRIO available under an emergency access mechanism called an Emergency Use Authorization (EUA) The EUA is supported by a Insurance account manager Health and Human Service (HHS) declaration that circumstances exist to justify emergency use of drugs and biological products during the COVID-19 pandemic. LAGEVRIO for the treatment of mild-to-moderate COVID-19 in adults with positive results of direct SARS-CoV-2 viral testing, who are at high risk for progression to severe COVID-19, including hospitalization or death, and for whom alternative COVID-19 treatment options approved or authorized by FDA are not accessible or clinically appropriate, has not undergone the same type of review as an FDA-approved product. In issuing an EUA under the COVID-19 public health emergency, the FDA has determined, among other things, that based on the total amount of scientific evidence available including data from adequate and well-controlled clinical trials, if available, it is reasonable to believe that the product may be effective for diagnosing, treating, or preventing COVID-19, or a serious or life-threatening disease or condition caused by COVID-19; that the known and potential benefits of the product, when used to diagnose, treat, or prevent such disease or condition, outweigh the known and potential risks of such product; and that there are no adequate, approved, and available alternatives.  All of these criteria must be met to allow for the product to be used in the treatment of patients during the COVID-19 pandemic. The EUA for  LAGEVRIO is in effect for the duration of the COVID-19 declaration justifying emergency use of LAGEVRIO, unless terminated or revoked (after which LAGEVRIO may no longer be used under the EUA). For patent information: LeaseGuru.tn Copyright  2021-2022 [employer] [place], [place] and its affiliates. All rights reserved. [id number] Revised: March 2022

## 2023-07-15 NOTE — Progress Notes (Signed)
Virtual Visit via Video Note  I connected with Lindsey Davis  on 07/15/23 at  5:40 PM EDT by a video enabled telemedicine application and verified that I am speaking with the correct person using two identifiers.  Location patient: [place] Location provider:work or home office Persons participating in the virtual visit: patient, provider  I discussed the limitations and requested verbal permission for telemedicine visit. The patient expressed understanding and agreed to proceed.   HPI:  Acute telemedicine visit for Covid19: -Onset: yesterday morning, tested positive yesterday -Symptoms include: headache, cough, runny nose -Denies: fevers, CP, SOB, NVD, malaise -able to eat and drink -Pertinent past medical history: see below, this is her 3rd time having covid, GFR was great in April -Pertinent medication allergies:No Known Allergies -COVID-19 vaccine status: did not get the last booster Immunization History  Administered Date(s) Administered   Influenza Whole 09/10/2008   Influenza,inj,Quad PF,6+ Mos 10/20/2017, 11/26/2018, 08/17/2019, 09/29/2021   Influenza-Unspecified 08/16/2014, 11/01/2015, 10/16/2020   PFIZER(Purple Top)SARS-COV-2 Vaccination 12/27/2019, 01/17/2020, 11/08/2020, 06/30/2021   Tdap 03/25/2013, 04/03/2023   Zoster Recombinant(Shingrix) 08/17/2019, 11/17/2019     ROS: See pertinent positives and negatives per HPI.  Past Medical History:  Diagnosis Date   Allergy    Anemia    Asthma    Fibrocystic breast    Iron deficiency anemia 09/20/2010   Resolved post menopause.      Past Surgical History:  Procedure Laterality Date   COLONOSCOPY     WRIST SURGERY       Current Outpatient Medications:    acetaminophen (TYLENOL) 500 MG tablet, Take 500 mg by mouth every 6 (six) hours as needed., Disp: , Rfl:    albuterol (VENTOLIN HFA) 108 (90 Base) MCG/ACT inhaler, Inhale into the lungs as directed., Disp: 6.7 g, Rfl: 5   alendronate (FOSAMAX) 70 MG tablet, Take 1 tablet (70  mg total) by mouth every 7 (seven) days. Take with a full glass of water on an empty stomach., Disp: 13 tablet, Rfl: 3   amphetamine-dextroamphetamine (ADDERALL XR) 30 MG 24 hr capsule, Take 1 capsule (30 mg total) by mouth in the morning., Disp: 30 capsule, Rfl: 0   amphetamine-dextroamphetamine (ADDERALL) 20 MG tablet, Take 1.5 tablets (30 mg total) by mouth daily., Disp: 45 tablet, Rfl: 0   benzonatate (TESSALON PERLES) 100 MG capsule, 1-2 capsules up to twice daily as needed for cough., Disp: 30 capsule, Rfl: 0   calcium carbonate (OS-CAL - DOSED IN MG OF ELEMENTAL CALCIUM) 1250 (500 Ca) MG tablet, Take 1 tablet by mouth., Disp: , Rfl:    Cholecalciferol (VITAMIN D3 PO), Take by mouth., Disp: , Rfl:    levothyroxine (SYNTHROID) 50 MCG tablet, Take 1 tablet (50 mcg total) by mouth daily., Disp: 90 tablet, Rfl: 3   Melatonin 10 MG TABS, Take by mouth., Disp: , Rfl:    molnupiravir EUA (LAGEVRIO) 200 mg CAPS capsule, Take 4 capsules (800 mg total) by mouth 2 (two) times daily for 5 days., Disp: 40 capsule, Rfl: 0   Multiple Vitamin (MULTIVITAMIN) capsule, Take 1 capsule by mouth daily., Disp: , Rfl:    traZODone (DESYREL) 50 MG tablet, Take 2-3 tablets (100-150 mg total) by mouth at bedtime., Disp: 270 tablet, Rfl: 1   vortioxetine HBr (TRINTELLIX) 20 MG TABS tablet, Take 1 tablet (20 mg total) by mouth daily., Disp: 90 tablet, Rfl: 1   amphetamine-dextroamphetamine (ADDERALL XR) 30 MG 24 hr capsule, Take 1 capsule by mouth every morning. (Patient not taking: Reported on 07/15/2023), Disp: 30 capsule,  Rfl: 0   amphetamine-dextroamphetamine (ADDERALL XR) 30 MG 24 hr capsule, Take 1 capsule (30 mg total) by mouth every morning. (Patient not taking: Reported on 07/15/2023), Disp: 30 capsule, Rfl: 0   amphetamine-dextroamphetamine (ADDERALL) 20 MG tablet, Take 1 and 1/2 tablets (30 mg total) by mouth daily. (Patient not taking: Reported on 07/15/2023), Disp: 45 tablet, Rfl: 0   ARIPiprazole (ABILIFY) 2 MG  tablet, Take 1 tablet (2 mg total) by mouth at bedtime. (Patient not taking: Reported on 07/15/2023), Disp: 90 tablet, Rfl: 1   ferrous sulfate 324 MG TBEC, Take 324 mg by mouth. (Patient not taking: Reported on 07/15/2023), Disp: , Rfl:    fluticasone (FLONASE) 50 MCG/ACT nasal spray, Place 2 sprays into both nostrils daily. (Patient not taking: Reported on 07/15/2023), Disp: 16 g, Rfl: 6   vortioxetine HBr (TRINTELLIX) 20 MG TABS tablet, Take 1 tablet by mouth daily. (Patient not taking: Reported on 07/15/2023), Disp: 90 tablet, Rfl: 0  EXAM:  VITALS per patient if applicable:  GENERAL: alert, oriented, appears well and in no acute distress  HEENT: atraumatic, conjunttiva clear, no obvious abnormalities on inspection of external nose and ears  NECK: normal movements of the head and neck  LUNGS: on inspection no signs of respiratory distress, breathing rate appears normal, no obvious gross SOB, gasping or wheezing  CV: no obvious cyanosis  MS: moves all visible extremities without noticeable abnormality  PSYCH/NEURO: pleasant and cooperative, no obvious depression or anxiety, speech and thought processing grossly intact  ASSESSMENT AND PLAN:  Discussed the following assessment and plan:  COVID   Discussed treatment options, side effect and risk of drug interactions, and precautions for COVID-19. She patient opted for treatment with Legevrio due to being higher risk for complications of covid or severe disease and after discussion potential interactions with her medications. The patient did want a prescription for cough, Tessalon Rx sent.  Other symptomatic care measures summarized in patient instructions.  Advised to seek prompt in person care if worsening, new symptoms arise, or if is not improving with treatment as expected per our conversation of expected course. Discussed options for follow up care. Did let this patient know that I do telemedicine on Tuesdays and Thursdays for [facility]  and those are the days I am logged into the system. Advised to schedule follow up visit with PCP, [facility] virtual visits or [facility] if any further questions or concerns to avoid delays in care.   I discussed the assessment and treatment plan with the patient. The patient was provided an opportunity to ask questions and all were answered. The patient agreed with the plan and demonstrated an understanding of the instructions.     Terressa Koyanagi, DO

## 2023-07-22 IMAGING — MG MM DIGITAL SCREENING BILAT W/ TOMO AND CAD
8 series · 8 of 24 positions shown · non-contrast
Comparison: Previous exam(s).

CLINICAL DATA: Screening.

EXAM:
DIGITAL SCREENING BILATERAL MAMMOGRAM WITH TOMOSYNTHESIS AND CAD
TECHNIQUE: Bilateral screening digital craniocaudal and mediolateral oblique
mammograms were obtained. Bilateral screening digital breast
tomosynthesis was performed. The images were evaluated with
computer-aided detection.

[R MLO synth-2D]
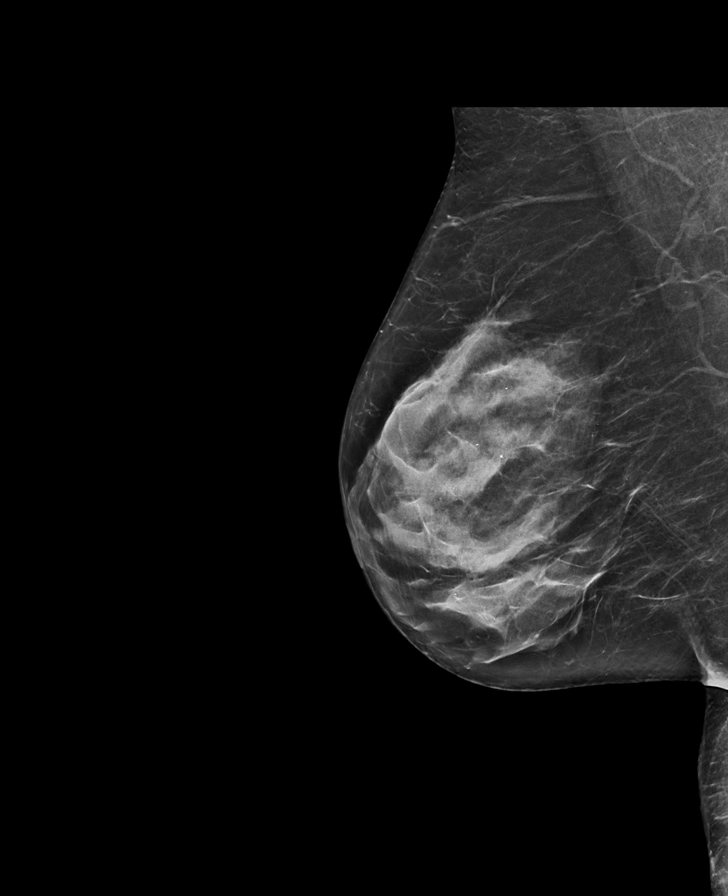

[R CC synth-2D]
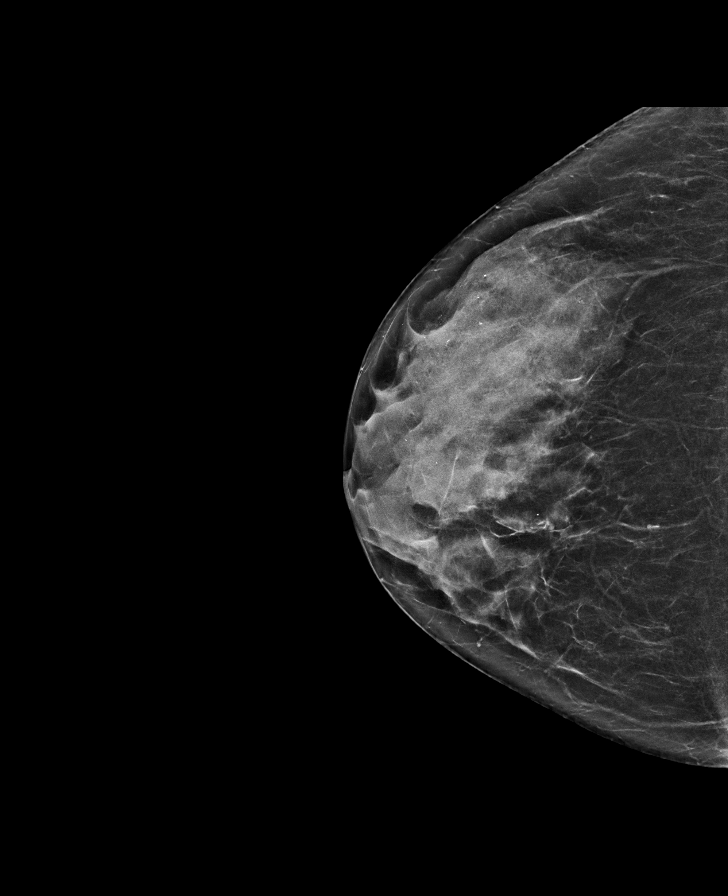

[L MLO synth-2D]
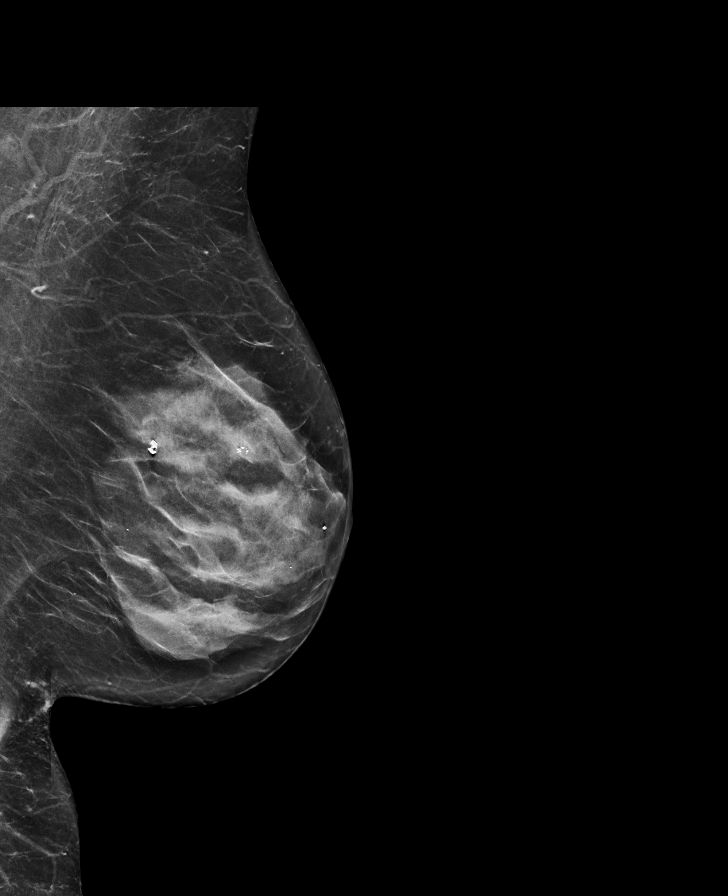

[L CC synth-2D]
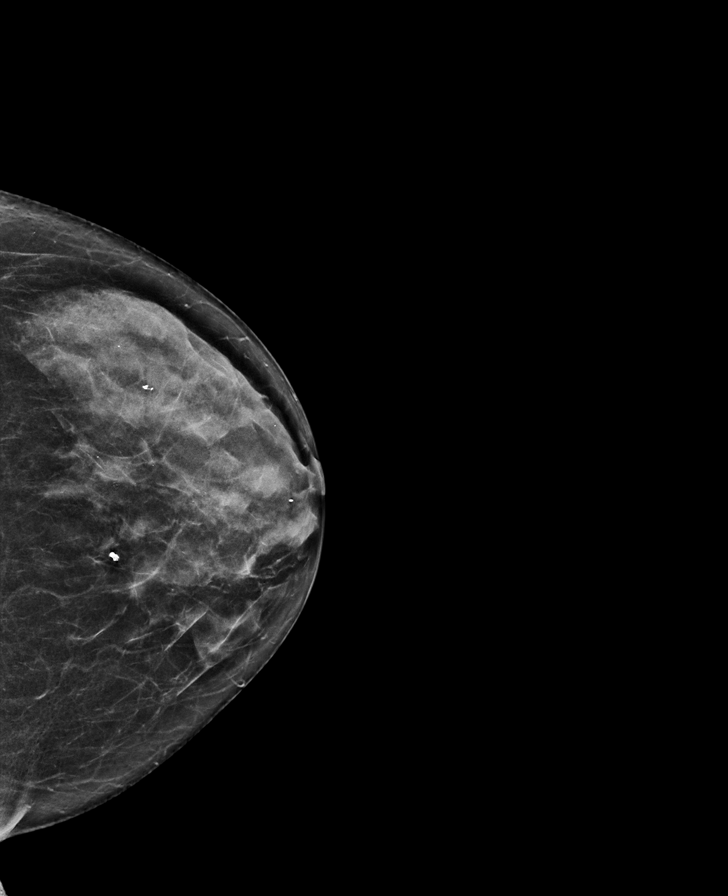

[L CC tomo · tomo slice 30/59.0]
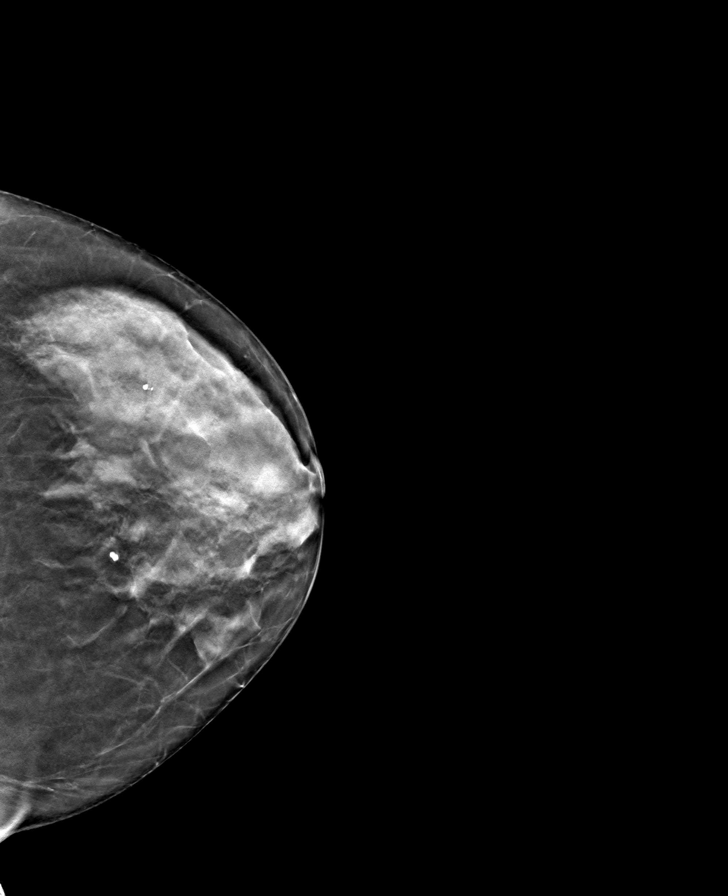

[L MLO tomo · tomo slice 36/71.0]
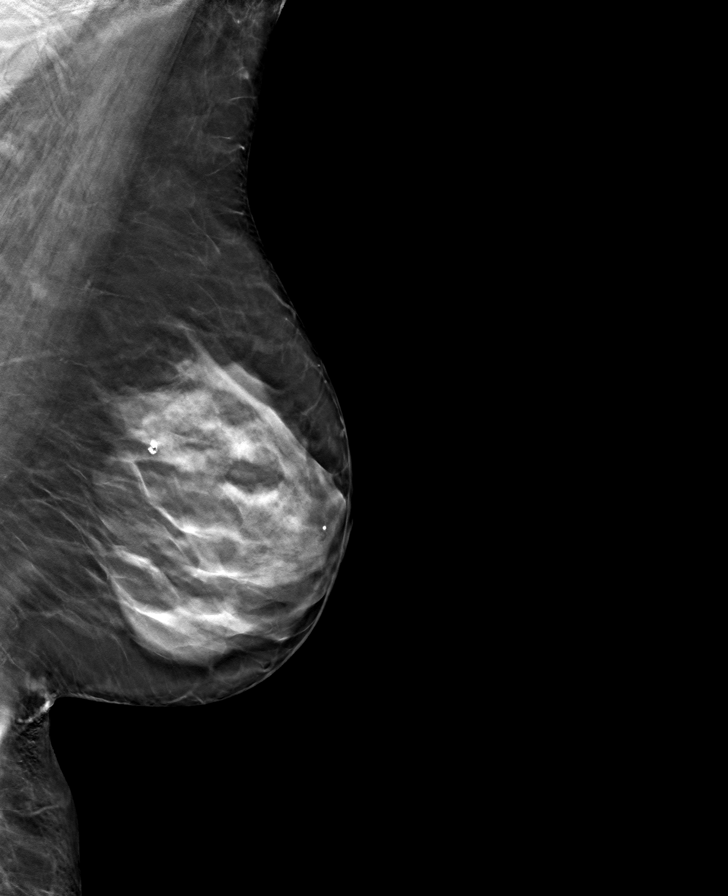

[R MLO tomo · tomo slice 36/71.0]
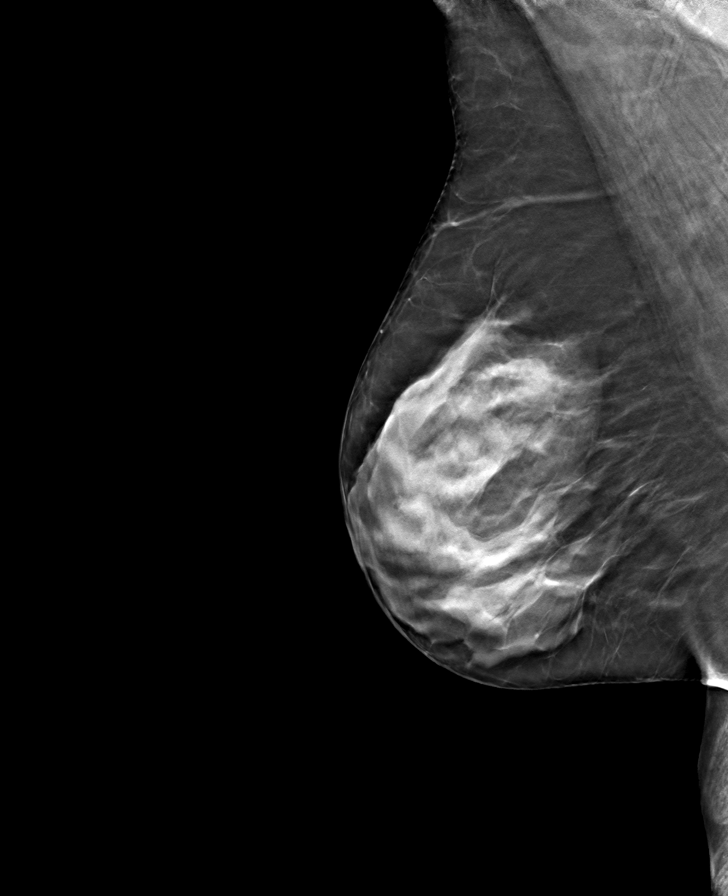

[R CC tomo · tomo slice 32/63.0]
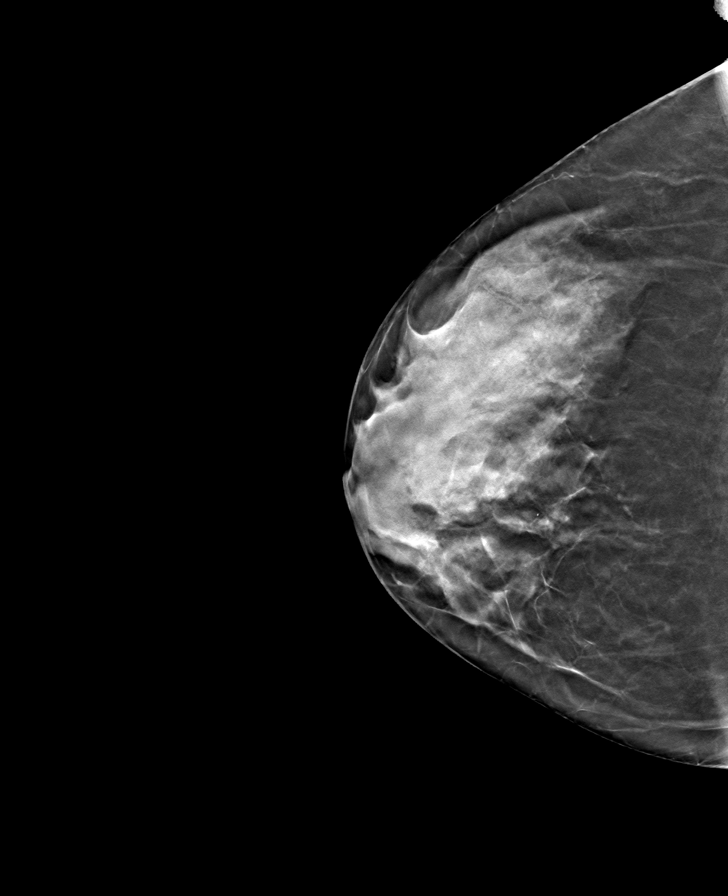

[8 of 24 positions shown; findings below may reference images not displayed]

ACR Breast Density Category d: The breast tissue is extremely dense,
which lowers the sensitivity of mammography
FINDINGS: There are no findings suspicious for malignancy.
IMPRESSION: No mammographic evidence of malignancy. A result letter of this
screening mammogram will be mailed directly to the patient.

RECOMMENDATION:
Screening mammogram in one year. (Code:[id number])

BI-RADS CATEGORY  1: Negative.

## 2023-07-27 ENCOUNTER — Other Ambulatory Visit (HOSPITAL_COMMUNITY): Payer: Self-pay

## 2023-07-28 ENCOUNTER — Other Ambulatory Visit (HOSPITAL_COMMUNITY): Payer: Self-pay

## 2023-07-28 MED ORDER — TRAZODONE HCL 50 MG PO TABS
100.0000 mg | ORAL_TABLET | Freq: Every day | ORAL | 1 refills | Status: DC
  Filled 2023-07-28: qty 270, 90d supply, fill #0
  Filled 2023-10-27: qty 270, 90d supply, fill #1

## 2023-08-02 ENCOUNTER — Other Ambulatory Visit (HOSPITAL_COMMUNITY): Payer: Self-pay

## 2023-08-04 ENCOUNTER — Other Ambulatory Visit (HOSPITAL_COMMUNITY): Payer: Self-pay

## 2023-08-04 MED ORDER — VRAYLAR 1.5 MG PO CAPS
1.5000 mg | ORAL_CAPSULE | Freq: Every day | ORAL | 0 refills | Status: DC
  Filled 2023-08-04: qty 30, 30d supply, fill #0

## 2023-08-06 DIAGNOSIS — F3342 Major depressive disorder, recurrent, in full remission: Secondary | ICD-10-CM | POA: Diagnosis not present

## 2023-08-06 DIAGNOSIS — F4322 Adjustment disorder with anxiety: Secondary | ICD-10-CM | POA: Diagnosis not present

## 2023-08-06 DIAGNOSIS — F900 Attention-deficit hyperactivity disorder, predominantly inattentive type: Secondary | ICD-10-CM | POA: Diagnosis not present

## 2023-08-08 ENCOUNTER — Other Ambulatory Visit (HOSPITAL_COMMUNITY): Payer: Self-pay

## 2023-08-08 MED ORDER — AMPHETAMINE-DEXTROAMPHETAMINE 20 MG PO TABS
30.0000 mg | ORAL_TABLET | Freq: Every day | ORAL | 0 refills | Status: DC
  Filled 2023-08-08: qty 45, 30d supply, fill #0

## 2023-08-08 MED ORDER — AMPHETAMINE-DEXTROAMPHET ER 30 MG PO CP24
30.0000 mg | ORAL_CAPSULE | Freq: Every morning | ORAL | 0 refills | Status: DC
  Filled 2023-08-08: qty 30, 30d supply, fill #0

## 2023-09-01 ENCOUNTER — Other Ambulatory Visit (HOSPITAL_COMMUNITY): Payer: Self-pay

## 2023-09-03 ENCOUNTER — Ambulatory Visit: Payer: 59 | Admitting: Internal Medicine

## 2023-09-06 ENCOUNTER — Other Ambulatory Visit (HOSPITAL_COMMUNITY): Payer: Self-pay

## 2023-09-06 MED ORDER — AMPHETAMINE-DEXTROAMPHETAMINE 20 MG PO TABS
30.0000 mg | ORAL_TABLET | Freq: Every day | ORAL | 0 refills | Status: DC
  Filled 2023-09-09: qty 45, 30d supply, fill #0

## 2023-09-06 MED ORDER — AMPHETAMINE-DEXTROAMPHET ER 30 MG PO CP24
30.0000 mg | ORAL_CAPSULE | Freq: Every morning | ORAL | 0 refills | Status: DC
  Filled 2023-09-09: qty 30, 30d supply, fill #0

## 2023-09-08 ENCOUNTER — Other Ambulatory Visit (HOSPITAL_COMMUNITY): Payer: Self-pay

## 2023-09-09 ENCOUNTER — Other Ambulatory Visit (HOSPITAL_COMMUNITY): Payer: Self-pay

## 2023-09-10 ENCOUNTER — Encounter: Payer: Self-pay | Admitting: Internal Medicine

## 2023-09-10 ENCOUNTER — Other Ambulatory Visit: Payer: Self-pay

## 2023-09-10 ENCOUNTER — Ambulatory Visit: Payer: 59 | Admitting: Internal Medicine

## 2023-09-10 VITALS — BP 124/80 | HR 66 | Ht 64.5 in | Wt 183.0 lb

## 2023-09-10 DIAGNOSIS — R82994 Hypercalciuria: Secondary | ICD-10-CM

## 2023-09-10 DIAGNOSIS — M810 Age-related osteoporosis without current pathological fracture: Secondary | ICD-10-CM | POA: Diagnosis not present

## 2023-09-10 LAB — ALBUMIN: Albumin: 4.3 g/dL (ref 3.5–5.2)

## 2023-09-10 LAB — VITAMIN D 25 HYDROXY (VIT D DEFICIENCY, FRACTURES): VITD: 64.85 ng/mL (ref 30.00–100.00)

## 2023-09-10 LAB — BASIC METABOLIC PANEL
BUN: 16 mg/dL (ref 6–23)
CO2: 28 mEq/L (ref 19–32)
Calcium: 9.5 mg/dL (ref 8.4–10.5)
Chloride: 102 mEq/L (ref 96–112)
Creatinine, Ser: 0.75 mg/dL (ref 0.40–1.20)
GFR: 85.01 mL/min (ref >60.00)
Glucose, Bld: 93 mg/dL (ref 70–99)
Potassium: 4.2 mEq/L (ref 3.5–5.1)
Sodium: 138 mEq/L (ref 135–145)

## 2023-09-10 LAB — TSH: TSH: 2.35 u[IU]/mL (ref 0.35–5.50)

## 2023-09-10 LAB — PHOSPHORUS: Phosphorus: 2.9 mg/dL (ref 2.3–4.6)

## 2023-09-10 MED ORDER — ALENDRONATE SODIUM 70 MG PO TABS: 70.0000 mg | ORAL_TABLET | ORAL | 3 refills | Status: AC

## 2023-09-10 NOTE — Progress Notes (Unsigned)
Name: Lindsey Davis  MRN/ DOB: [id number], 05-28-1961    Age/ Sex: 62 y.o., female    PCP: Shelva Majestic, MD   Reason for Endocrinology Evaluation: Osteoporosis      Date of Initial Endocrinology Evaluation: 03/04/2022    HPI: Lindsey Davis is a 62 y.o. female with a past medical history of Osteoporosis, Hypothyroidism . The patient presented for initial endocrinology clinic visit on 03/04/2022  for consultative assistance with her Osteoporosis .   Pt was diagnosed with osteoporosis: 11/2021 with a T-score -3.6 at the spine   Menarche at age : does not recall  Menopausal at age : at age  40 Fracture Hx: while skating - right wrist fracture  Hx of HRT: no  FH of osteoporosis or hip fracture: no Prior Hx of anti-estrogenic therapy :no  Prior Hx of anti-resorptive therapy : Started alendronate in March 2023   On her initial visit to our clinic she had normal GFR, serum calcium, phosphorus, magnesium and a vitamin D of 63.56 NG/mL, PTH normal at 35 PG/mL, normal TFTs but she has been noted with elevated 24-hour urinary calcium excretion at 330 mg      SUBJECTIVE:    Today (09/10/23): Lindsey Davis is here for a follow up on osteoporosis and hypercalciuria.   Denies heartburn  No recent falls Denies constipation or diarrhea  Denies bone fractures  She is tolerating alendronate without side effects   Oyster Calcium 500 + Vit D 5 mcg( takes 2 tabs )  She is also on MVI    gummies  Vitamin D 3000 iu daily    HISTORY:  Past Medical History:  Past Medical History:  Diagnosis Date   Allergy    Anemia    Asthma    Fibrocystic breast    Iron deficiency anemia 09/20/2010   Resolved post menopause.     Past Surgical History:  Past Surgical History:  Procedure Laterality Date   COLONOSCOPY     WRIST SURGERY      Social History:  reports that she quit smoking about 24 years ago. Her smoking use included cigarettes. She started smoking about 34 years ago. She  has a 10 pack-year smoking history. She has never used smokeless tobacco. She reports that she does not drink alcohol and does not use drugs. Family History: family history includes Breast cancer in her mother and sister; Cancer in her paternal grandmother; Heart attack in her father; Urinary tract infection in her mother.   HOME MEDICATIONS: Allergies as of 09/10/2023   No Known Allergies      Medication List        Accurate as of September 10, 2023  8:11 AM. If you have any questions, ask your nurse or doctor.          STOP taking these medications    Trintellix 20 MG Tabs tablet Generic drug: vortioxetine HBr Stopped by: Johnney Ou Vianne Grieshop   Vraylar 1.5 MG capsule Generic drug: cariprazine Stopped by: Johnney Ou Cheryel Kyte       TAKE these medications    acetaminophen 500 MG tablet Commonly known as: TYLENOL Take 500 mg by mouth every 6 (six) hours as needed.   albuterol 108 (90 Base) MCG/ACT inhaler Commonly known as: VENTOLIN HFA Inhale into the lungs as directed.   alendronate 70 MG tablet Commonly known as: FOSAMAX Take 1 tablet (70 mg total) by mouth every 7 (seven) days. Take with a full glass of water on  an empty stomach.   amphetamine-dextroamphetamine 30 MG 24 hr capsule Commonly known as: Adderall XR Take 1 capsule by mouth every morning.   amphetamine-dextroamphetamine 20 MG tablet Commonly known as: Adderall Take 1 and 1/2 tablets (30 mg total) by mouth daily.   amphetamine-dextroamphetamine 30 MG 24 hr capsule Commonly known as: Adderall XR Take 1 capsule (30 mg total) by mouth every morning.   amphetamine-dextroamphetamine 20 MG tablet Commonly known as: Adderall Take 1.5 tablets (30 mg total) by mouth daily.   amphetamine-dextroamphetamine 30 MG 24 hr capsule Commonly known as: Adderall XR Take 1 capsule (30 mg total) by mouth every morning.   ARIPiprazole 2 MG tablet Commonly known as: ABILIFY Take 1 tablet (2 mg total) by mouth  at bedtime.   Auvelity 45-105 MG Tbcr Generic drug: Dextromethorphan-buPROPion ER Take 1 tablet by mouth 2 (two) times daily.   benzonatate 100 MG capsule Commonly known as: Tessalon Perles 1-2 capsules up to twice daily as needed for cough.   calcium carbonate 1250 (500 Ca) MG tablet Commonly known as: OS-CAL - dosed in mg of elemental calcium Take 2 tablets by mouth.   ferrous sulfate 324 MG Tbec Take 324 mg by mouth.   fluticasone 50 MCG/ACT nasal spray Commonly known as: FLONASE Place 2 sprays into both nostrils daily.   levothyroxine 50 MCG tablet Commonly known as: SYNTHROID Take 1 tablet (50 mcg total) by mouth daily.   Melatonin 10 MG Tabs Take by mouth.   multivitamin capsule Take 1 capsule by mouth daily.   traZODone 50 MG tablet Commonly known as: DESYREL Take 2 - 3 tablets (100 - 150 mg total) by mouth at bedtime.   VITAMIN D3 PO Take by mouth.           OBJECTIVE:  VS: BP 124/80 (BP Location: Left Arm, Patient Position: Sitting, Cuff Size: Large)   Pulse 66   Ht 5' 4.5" (1.638 m)   Wt 183 lb (83 kg)   LMP 03/31/2011   SpO2 98%   BMI 30.93 kg/m    Wt Readings from Last 3 Encounters:  09/10/23 183 lb (83 kg)  07/15/23 186 lb (84.4 kg)  04/03/23 180 lb 6.4 oz (81.8 kg)     EXAM: General: Pt appears well and is in NAD  Neck: General: Supple without adenopathy. Thyroid: Thyroid size normal.  No goiter or nodules appreciated.   Lungs: Clear with good BS bilat with no rales, rhonchi, or wheezes  Heart: Auscultation: RRR.  Abdomen: Normoactive bowel sounds, soft, nontender, without masses or organomegaly palpable  Extremities:  BL LE: No pretibial edema normal ROM and strength.  Mental Status: Judgment, insight: Intact Orientation: Oriented to time, place, and person Mood and affect: No depression, anxiety, or agitation     DATA REVIEWED:   Latest Reference Range & Units 09/10/23 08:16  Sodium 135 - 145 mEq/L 138  Potassium 3.5 -  5.1 mEq/L 4.2  Chloride 96 - 112 mEq/L 102  CO2 19 - 32 mEq/L 28  Glucose 70 - 99 mg/dL 93  BUN 6 - 23 mg/dL 16  Creatinine 4.09 - 8.11 mg/dL 9.14  Calcium 8.4 - 78.2 mg/dL 9.5  Phosphorus 2.3 - 4.6 mg/dL 2.9  Albumin 3.5 - 5.2 g/dL 4.3  GFR >95.62 mL/min 85.01    Latest Reference Range & Units 09/10/23 08:16  VITD 30.00 - 100.00 ng/mL 64.85    Latest Reference Range & Units 09/10/23 08:16  TSH 0.35 - 5.50 uIU/mL 2.35  Latest Reference Range & Units 03/11/22 08:26  Calcium, 24H Urine mg/24 h 330 (H)  Creatinine, 24H Ur 0.50 - 2.15 g/24 h 1.18  (H): Data is abnormally high   DXA 12/13/2021  Results:   Lumbar spine L1-L4 Femoral neck (FN)  T-score -3.6 RFN: -1.9 LFN: -2.0     ASSESSMENT/PLAN/RECOMMENDATIONS:   Osteoporosis :  - We emphasized the importance of calcium and vitamin D  - Her fracture is not considered a fragility as this was a skiing incident  -She is tolerating alendronate without any side effects -I have recommended postponing DXA scan until next year, that way she would be on alendronate for 2 years - Labs showed normal BMP, TSH, and serum calcium    Medications : Continue alendronate  70 mg weekly  Calcium 1200 mg daily  Decrease Vitamin D 2000 iu daily    2. Hypercalciuria:  -Idiopathic -I had attempted to decrease her vitamin D from 5000 units to 3000, she is not sure if she is still taking the 5000 or if she is currently on the 3000 units daily, her vitamin D has increased over the past year, and I suspect she is probably still on the 5000 units daily, I will reduce vitamin D as below -Will repeat 24-hour urinary calcium excretion -If this remains >250 I will consider reducing [place] calcium as well as vitamin D depending on her levels  Medication  Decrease vitamin D 2000 international units daily  F/U in 1     Signed electronically by: Lyndle Herrlich, MD  [facility] Endocrinology  [facility] [address] [place] [place], [place] [address] Phone: [phone] FAX: [phone]   CC: Shelva Majestic, MD [address] [place] [address] Phone: [phone] Fax: [phone]   Return to Endocrinology clinic as below: Future Appointments  Date Time Provider Department Center  09/09/2024  7:30 AM Sherril Heyward, Konrad Dolores, MD [facility] None

## 2023-09-10 NOTE — Patient Instructions (Signed)
24-Hour Urine Collection   You will be collecting your urine for a 24-hour period of time.  Your timer starts with your first urine of the morning (For example - If you first pee at 9AM, your timer will start at 9AM)  Throw away your first urine of the morning  Collect your urine every time you pee for the next 24 hours STOP your urine collection 24 hours after you started the collection (For example - You would stop at 9AM the day after you started)

## 2023-09-11 ENCOUNTER — Other Ambulatory Visit (HOSPITAL_COMMUNITY): Payer: Self-pay

## 2023-09-11 LAB — PARATHYROID HORMONE, INTACT (NO CA): PTH: 24 pg/mL (ref 16–77)

## 2023-09-22 ENCOUNTER — Other Ambulatory Visit: Payer: 59

## 2023-09-22 DIAGNOSIS — R82994 Hypercalciuria: Secondary | ICD-10-CM | POA: Diagnosis not present

## 2023-09-23 ENCOUNTER — Other Ambulatory Visit: Payer: Self-pay | Admitting: Internal Medicine

## 2023-09-23 ENCOUNTER — Other Ambulatory Visit (HOSPITAL_COMMUNITY): Payer: Self-pay

## 2023-09-23 LAB — CREATININE, URINE, 24 HOUR: Creatinine, 24H Ur: 0.96 g/(24.h) (ref 0.50–2.15)

## 2023-09-23 LAB — CALCIUM, URINE, 24 HOUR: Calcium, 24H Urine: 66 mg/(24.h)

## 2023-09-23 MED ORDER — ALENDRONATE SODIUM 70 MG PO TABS
70.0000 mg | ORAL_TABLET | ORAL | 3 refills | Status: DC
  Filled 2023-09-23 – 2023-09-26 (×2): qty 12, 84d supply, fill #0
  Filled 2023-12-11: qty 12, 84d supply, fill #1

## 2023-09-24 DIAGNOSIS — F331 Major depressive disorder, recurrent, moderate: Secondary | ICD-10-CM | POA: Diagnosis not present

## 2023-09-24 DIAGNOSIS — F900 Attention-deficit hyperactivity disorder, predominantly inattentive type: Secondary | ICD-10-CM | POA: Diagnosis not present

## 2023-09-26 ENCOUNTER — Other Ambulatory Visit (HOSPITAL_COMMUNITY): Payer: Self-pay

## 2023-10-09 ENCOUNTER — Other Ambulatory Visit: Payer: Self-pay

## 2023-10-09 ENCOUNTER — Other Ambulatory Visit (HOSPITAL_COMMUNITY): Payer: Self-pay

## 2023-10-09 MED ORDER — AUVELITY 45-105 MG PO TBCR
1.0000 | EXTENDED_RELEASE_TABLET | Freq: Two times a day (BID) | ORAL | 2 refills | Status: DC
  Filled 2023-10-09: qty 60, 30d supply, fill #0
  Filled 2024-01-03: qty 60, 30d supply, fill #1
  Filled 2024-02-04: qty 60, 30d supply, fill #2

## 2023-10-10 ENCOUNTER — Other Ambulatory Visit (HOSPITAL_COMMUNITY): Payer: Self-pay

## 2023-10-10 MED ORDER — AMPHETAMINE-DEXTROAMPHETAMINE 20 MG PO TABS
30.0000 mg | ORAL_TABLET | Freq: Every day | ORAL | 0 refills | Status: DC
  Filled 2023-10-10: qty 45, 30d supply, fill #0

## 2023-10-10 MED ORDER — AMPHETAMINE-DEXTROAMPHET ER 30 MG PO CP24
30.0000 mg | ORAL_CAPSULE | Freq: Every morning | ORAL | 0 refills | Status: DC
  Filled 2023-10-10: qty 30, 30d supply, fill #0

## 2023-10-11 ENCOUNTER — Other Ambulatory Visit (HOSPITAL_COMMUNITY): Payer: Self-pay

## 2023-10-15 ENCOUNTER — Other Ambulatory Visit (HOSPITAL_COMMUNITY): Payer: Self-pay

## 2023-10-15 MED ORDER — TRINTELLIX 10 MG PO TABS
10.0000 mg | ORAL_TABLET | Freq: Every day | ORAL | 2 refills | Status: DC
  Filled 2023-10-15: qty 30, 30d supply, fill #0
  Filled 2023-10-27 – 2024-04-26 (×2): qty 30, 30d supply, fill #1

## 2023-10-17 ENCOUNTER — Other Ambulatory Visit (HOSPITAL_COMMUNITY): Payer: Self-pay

## 2023-10-27 ENCOUNTER — Other Ambulatory Visit (HOSPITAL_COMMUNITY): Payer: Self-pay

## 2023-10-28 ENCOUNTER — Other Ambulatory Visit (HOSPITAL_COMMUNITY): Payer: Self-pay

## 2023-10-28 ENCOUNTER — Other Ambulatory Visit: Payer: Self-pay

## 2023-11-01 ENCOUNTER — Other Ambulatory Visit (HOSPITAL_COMMUNITY): Payer: Self-pay

## 2023-11-05 ENCOUNTER — Other Ambulatory Visit (HOSPITAL_COMMUNITY): Payer: Self-pay

## 2023-11-06 ENCOUNTER — Other Ambulatory Visit (HOSPITAL_COMMUNITY): Payer: Self-pay

## 2023-11-06 MED ORDER — AMPHETAMINE-DEXTROAMPHETAMINE 20 MG PO TABS
30.0000 mg | ORAL_TABLET | Freq: Every day | ORAL | 0 refills | Status: DC
  Filled 2023-11-10: qty 45, 30d supply, fill #0

## 2023-11-06 MED ORDER — AMPHETAMINE-DEXTROAMPHET ER 30 MG PO CP24
30.0000 mg | ORAL_CAPSULE | Freq: Every morning | ORAL | 0 refills | Status: DC
  Filled 2023-11-10: qty 30, 30d supply, fill #0

## 2023-11-06 MED ORDER — TRINTELLIX 10 MG PO TABS
10.0000 mg | ORAL_TABLET | Freq: Every day | ORAL | 0 refills | Status: DC
  Filled 2023-11-06 – 2023-11-08 (×3): qty 90, 90d supply, fill #0

## 2023-11-08 ENCOUNTER — Other Ambulatory Visit (HOSPITAL_COMMUNITY): Payer: Self-pay

## 2023-11-10 ENCOUNTER — Other Ambulatory Visit (HOSPITAL_COMMUNITY): Payer: Self-pay

## 2023-11-11 ENCOUNTER — Other Ambulatory Visit (HOSPITAL_COMMUNITY): Payer: Self-pay

## 2023-12-11 ENCOUNTER — Other Ambulatory Visit (HOSPITAL_COMMUNITY): Payer: Self-pay

## 2023-12-11 MED ORDER — AMPHETAMINE-DEXTROAMPHET ER 30 MG PO CP24
30.0000 mg | ORAL_CAPSULE | Freq: Every morning | ORAL | 0 refills | Status: DC
  Filled 2023-12-11: qty 30, 30d supply, fill #0

## 2023-12-11 MED ORDER — AMPHETAMINE-DEXTROAMPHETAMINE 20 MG PO TABS
30.0000 mg | ORAL_TABLET | Freq: Every day | ORAL | 0 refills | Status: DC
  Filled 2023-12-11: qty 45, 30d supply, fill #0

## 2023-12-12 ENCOUNTER — Other Ambulatory Visit (HOSPITAL_COMMUNITY): Payer: Self-pay

## 2024-01-05 ENCOUNTER — Other Ambulatory Visit (HOSPITAL_COMMUNITY): Payer: Self-pay

## 2024-01-05 MED ORDER — AMPHETAMINE-DEXTROAMPHET ER 30 MG PO CP24
30.0000 mg | ORAL_CAPSULE | Freq: Every morning | ORAL | 0 refills | Status: DC
  Filled 2024-01-10: qty 30, 30d supply, fill #0

## 2024-01-05 MED ORDER — AMPHETAMINE-DEXTROAMPHETAMINE 20 MG PO TABS
20.5000 mg | ORAL_TABLET | Freq: Every day | ORAL | 0 refills | Status: DC
  Filled 2024-01-10: qty 45, 30d supply, fill #0

## 2024-01-07 ENCOUNTER — Other Ambulatory Visit (HOSPITAL_COMMUNITY): Payer: Self-pay

## 2024-01-08 ENCOUNTER — Other Ambulatory Visit: Payer: Self-pay

## 2024-01-10 ENCOUNTER — Other Ambulatory Visit (HOSPITAL_COMMUNITY): Payer: Self-pay

## 2024-01-23 ENCOUNTER — Other Ambulatory Visit (HOSPITAL_COMMUNITY): Payer: Self-pay

## 2024-01-23 MED ORDER — TRAZODONE HCL 50 MG PO TABS
100.0000 mg | ORAL_TABLET | Freq: Every day | ORAL | 0 refills | Status: DC
  Filled 2024-01-23 – 2024-02-05 (×3): qty 270, 90d supply, fill #0

## 2024-02-01 ENCOUNTER — Other Ambulatory Visit: Payer: Self-pay | Admitting: Family Medicine

## 2024-02-02 ENCOUNTER — Other Ambulatory Visit (HOSPITAL_COMMUNITY): Payer: Self-pay

## 2024-02-02 MED ORDER — ALBUTEROL SULFATE HFA 108 (90 BASE) MCG/ACT IN AERS
INHALATION_SPRAY | RESPIRATORY_TRACT | 5 refills | Status: AC
  Filled 2024-02-02: qty 6.7, 30d supply, fill #0
  Filled 2024-03-26: qty 6.7, 30d supply, fill #1

## 2024-02-04 ENCOUNTER — Other Ambulatory Visit (HOSPITAL_COMMUNITY): Payer: Self-pay

## 2024-02-05 ENCOUNTER — Other Ambulatory Visit: Payer: Self-pay

## 2024-02-05 ENCOUNTER — Other Ambulatory Visit (HOSPITAL_COMMUNITY): Payer: Self-pay

## 2024-02-05 MED ORDER — TRINTELLIX 10 MG PO TABS
10.0000 mg | ORAL_TABLET | Freq: Every day | ORAL | 0 refills | Status: DC
  Filled 2024-02-05: qty 90, 90d supply, fill #0

## 2024-02-07 ENCOUNTER — Other Ambulatory Visit (HOSPITAL_COMMUNITY): Payer: Self-pay

## 2024-02-10 ENCOUNTER — Other Ambulatory Visit (HOSPITAL_COMMUNITY): Payer: Self-pay

## 2024-02-10 MED ORDER — AMPHETAMINE-DEXTROAMPHET ER 30 MG PO CP24
30.0000 mg | ORAL_CAPSULE | Freq: Every morning | ORAL | 0 refills | Status: DC
  Filled 2024-02-10: qty 30, 30d supply, fill #0

## 2024-02-10 MED ORDER — AMPHETAMINE-DEXTROAMPHETAMINE 20 MG PO TABS
30.0000 mg | ORAL_TABLET | Freq: Every day | ORAL | 0 refills | Status: DC
Start: 2024-02-10 — End: 2024-03-04
  Filled 2024-02-10: qty 45, 30d supply, fill #0

## 2024-02-13 DIAGNOSIS — R059 Cough, unspecified: Secondary | ICD-10-CM | POA: Diagnosis not present

## 2024-02-13 DIAGNOSIS — J029 Acute pharyngitis, unspecified: Secondary | ICD-10-CM | POA: Diagnosis not present

## 2024-02-13 DIAGNOSIS — Z20822 Contact with and (suspected) exposure to covid-19: Secondary | ICD-10-CM | POA: Diagnosis not present

## 2024-03-04 ENCOUNTER — Ambulatory Visit: Admitting: Family Medicine

## 2024-03-04 ENCOUNTER — Ambulatory Visit

## 2024-03-04 ENCOUNTER — Encounter: Payer: Self-pay | Admitting: Family Medicine

## 2024-03-04 VITALS — BP 130/80 | HR 88 | Temp 97.2°F | Ht 64.5 in | Wt 174.6 lb

## 2024-03-04 DIAGNOSIS — S2242XD Multiple fractures of ribs, left side, subsequent encounter for fracture with routine healing: Secondary | ICD-10-CM | POA: Diagnosis not present

## 2024-03-04 DIAGNOSIS — R052 Subacute cough: Secondary | ICD-10-CM

## 2024-03-04 DIAGNOSIS — R0789 Other chest pain: Secondary | ICD-10-CM

## 2024-03-04 DIAGNOSIS — J4520 Mild intermittent asthma, uncomplicated: Secondary | ICD-10-CM | POA: Diagnosis not present

## 2024-03-04 DIAGNOSIS — R0781 Pleurodynia: Secondary | ICD-10-CM

## 2024-03-04 DIAGNOSIS — R059 Cough, unspecified: Secondary | ICD-10-CM | POA: Diagnosis not present

## 2024-03-04 MED ORDER — BENZONATATE 100 MG PO CAPS: ORAL_CAPSULE | ORAL | 0 refills | Status: DC

## 2024-03-04 NOTE — Progress Notes (Signed)
 Phone [phone] In person visit   Subjective:   Lindsey Davis is a 63 y.o. year old very pleasant female patient who presents for/with See problem oriented charting Chief Complaint  Patient presents with   Cough    Pt c/o cough that happens at night and has been going on since 02/28 after husband got diagnosed for flu.   Fall    C/o rib cage pain    Past Medical History-  Patient Active Problem List   Diagnosis Date Noted   Age-related osteoporosis without current pathological fracture 03/04/2022    Priority: Medium    Hypothyroidism 01/17/2016    Priority: Medium    Obesity 10/21/2014    Priority: Medium    Depression 03/28/2008    Priority: Medium    Attention deficit disorder 03/28/2008    Priority: Medium    Asthma 02/09/2008    Priority: Medium    Allergic rhinitis 02/09/2008    Priority: Low    Medications- reviewed and updated Current Outpatient Medications  Medication Sig Dispense Refill   acetaminophen (TYLENOL) 500 MG tablet Take 500 mg by mouth every 6 (six) hours as needed.     albuterol (VENTOLIN HFA) 108 (90 Base) MCG/ACT inhaler Inhale into the lungs as directed. 6.7 g 5   alendronate (FOSAMAX) 70 MG tablet Take 1 tablet (70 mg total) by mouth every 7 (seven) days. Take with a full glass of water on an empty stomach. 13 tablet 3   amphetamine-dextroamphetamine (ADDERALL XR) 30 MG 24 hr capsule Take 1 capsule by mouth every morning. 30 capsule 0   amphetamine-dextroamphetamine (ADDERALL XR) 30 MG 24 hr capsule Take 1 capsule (30 mg total) by mouth every morning. 30 capsule 0   amphetamine-dextroamphetamine (ADDERALL XR) 30 MG 24 hr capsule Take 1 capsule (30 mg total) by mouth every morning. 30 capsule 0   ARIPiprazole (ABILIFY) 2 MG tablet Take 1 tablet (2 mg total) by mouth at bedtime. 90 tablet 1   calcium carbonate (OS-CAL - DOSED IN MG OF ELEMENTAL CALCIUM) 1250 (500 Ca) MG tablet Take 2 tablets by mouth.     Cholecalciferol (VITAMIN D3 PO) Take  by mouth.     Dextromethorphan-buPROPion ER (AUVELITY) 45-105 MG TBCR Take 1 tablet by mouth 2 (two) times daily (at least 8 hours apart) 60 tablet 2   ferrous sulfate 324 MG TBEC Take 324 mg by mouth.     fluticasone (FLONASE) 50 MCG/ACT nasal spray Place 2 sprays into both nostrils daily. 16 g 6   levothyroxine (SYNTHROID) 50 MCG tablet Take 1 tablet (50 mcg total) by mouth daily. 90 tablet 3   Melatonin 10 MG TABS Take by mouth.     Multiple Vitamin (MULTIVITAMIN) capsule Take 1 capsule by mouth daily.     traZODone (DESYREL) 50 MG tablet Take 2 - 3 tablets (100 - 150 mg total) by mouth at bedtime. 270 tablet 0   vortioxetine HBr (TRINTELLIX) 10 MG TABS tablet Take 1 tablet (10 mg total) by mouth daily. 30 tablet 2   benzonatate (TESSALON PERLES) 100 MG capsule 1-2 capsules up to twice daily as needed for cough. 30 capsule 0   No current facility-administered medications for this visit.     Objective:  BP 130/80   Pulse 88   Temp (!) 97.2 F (36.2 C)   Ht 5' 4.5" (1.638 m)   Wt 174 lb 9.6 oz (79.2 kg)   LMP 03/31/2011   SpO2 96%   BMI 29.51 kg/m  Gen: NAD, intermittent cough throughout visit Tympanic membrane's normal, nasal turbinates normal, oropharynx largely normal CV: RRR no murmurs rubs or gallops Lungs: CTAB no crackles, wheeze, rhonchi Does have left lower rib pain with palpation Ext: no edema Skin: warm, dry     Assessment and Plan   # Subacute cough #left sided rib pain S: Patient reports symptoms since February 28.  Her husband had been diagnosed with the flu prior to that.  She ended up with positive influenza A at urgent care and started on tamiflu and tessalon Perles.   March 7th had taken a walk and underestimated a concrete step and fell forward onto left knee and started with pain on the left side. Hard to get good clearing cough after the fall with rib tenderness. Had been improving some from flu but seemed to worsen after this.   This morning coughed  some heavy coughing fits and once threw up- gets gagged by the coughing and this has happened similarly multiple days and times.   -She does have a known history of asthma and has albuterol available (has tried but not helping)- but denies wheezing or shortness of breath.  -trying to do mucinex sinus, tessalon Perles, cough syrup (didn't help)- nothing seems to help that much. Still using cough drops and staying well hydrated.   A/P: Patient with over 3 weeks of cough/subacute cough after prior influenza A diagnosis even with treatment with Tamiflu.  She was improving some and then had a fall onto her left ribs and seems of had some worsening cough since that time-hard to get a good deep breath.  Will get left-sided rib films to rule out fracture but also to evaluate for pneumonia on that side - She request medication to help with cough while we are waiting on x-ray-her ADD treatment inhibits the effects of medication like Hycodan and codeine cough syrup which is likely why she got little benefit from Hycodan this time.  She also has interaction with promethazine dextromethorphan as her ADD medicine has a long-acting dextromethorphan in it-we ultimately opted for Tessalon but she will try up to 2 at a time 2 or even possibly 3 times a day as there is no listed interactions -She does have asthma but that has been well-controlled and albuterol has not been helpful with her symptoms though we did consider prednisone for postviral cough - Bronchitis also in differential and we discussed low yield of antibiotics unless there is clearly a pneumonia which we are evaluating for   Recommended follow up: Return for as needed for new, worsening, persistent symptoms. Future Appointments  Date Time Provider Department Center  03/04/2024  1:30 PM [facility] 1 [facility] None  09/09/2024  7:30 AM Shamleffer, Konrad Dolores, MD [facility] None    Lab/Order associations:   ICD-10-CM   1. Subacute cough  R05.2  DG Ribs Unilateral W/Chest Left    2. Rib pain on left side  R07.81 DG Ribs Unilateral W/Chest Left    3. Mild intermittent asthma without complication  J45.20       Meds ordered this encounter  Medications   benzonatate (TESSALON PERLES) 100 MG capsule    Sig: 1-2 capsules up to twice daily as needed for cough.    Dispense:  30 capsule    Refill:  0   Return precautions advised.  Tana Conch, MD

## 2024-03-04 NOTE — Patient Instructions (Addendum)
  Stop by x-ray before you go   We are considering prednisone if no pneumonia is found.  Also try to rule out rib fracture  Try Tessalon for cough given interactions with most syrups  Recommended follow up: Return for as needed for new, worsening, persistent symptoms.  Particularly fever or new onset shortness of breath

## 2024-03-09 ENCOUNTER — Encounter: Payer: Self-pay | Admitting: Family Medicine

## 2024-03-09 ENCOUNTER — Telehealth: Payer: Self-pay

## 2024-03-09 NOTE — Telephone Encounter (Signed)
 Called and spoke with Radiology and they will place a note on this for it to be read.

## 2024-03-09 NOTE — Telephone Encounter (Signed)
-----   Message from Tana Conch sent at 03/09/2024  9:03 AM EDT ----- Possible to get radiology read on this? ----- Message ----- From: SYSTEM Sent: 03/09/2024  12:15 AM EDT To: Shelva Majestic, MD

## 2024-03-12 ENCOUNTER — Other Ambulatory Visit (HOSPITAL_COMMUNITY): Payer: Self-pay

## 2024-03-12 ENCOUNTER — Other Ambulatory Visit: Payer: Self-pay | Admitting: Internal Medicine

## 2024-03-12 ENCOUNTER — Other Ambulatory Visit: Payer: Self-pay

## 2024-03-12 MED ORDER — ALENDRONATE SODIUM 70 MG PO TABS
70.0000 mg | ORAL_TABLET | ORAL | 0 refills | Status: DC
  Filled 2024-03-12: qty 12, 84d supply, fill #0

## 2024-03-12 MED ORDER — AMPHETAMINE-DEXTROAMPHET ER 30 MG PO CP24
30.0000 mg | ORAL_CAPSULE | Freq: Every morning | ORAL | 0 refills | Status: DC
  Filled 2024-03-12: qty 30, 30d supply, fill #0

## 2024-03-12 MED ORDER — AMPHETAMINE-DEXTROAMPHETAMINE 20 MG PO TABS
30.0000 mg | ORAL_TABLET | Freq: Every day | ORAL | 0 refills | Status: DC
  Filled 2024-03-12: qty 45, 30d supply, fill #0

## 2024-03-22 ENCOUNTER — Other Ambulatory Visit: Payer: Self-pay | Admitting: Family Medicine

## 2024-03-22 DIAGNOSIS — Z1231 Encounter for screening mammogram for malignant neoplasm of breast: Secondary | ICD-10-CM

## 2024-04-01 ENCOUNTER — Other Ambulatory Visit (HOSPITAL_COMMUNITY): Payer: Self-pay

## 2024-04-02 ENCOUNTER — Other Ambulatory Visit: Payer: Self-pay | Admitting: Family Medicine

## 2024-04-02 ENCOUNTER — Other Ambulatory Visit (HOSPITAL_COMMUNITY): Payer: Self-pay

## 2024-04-06 ENCOUNTER — Other Ambulatory Visit (HOSPITAL_COMMUNITY): Payer: Self-pay

## 2024-04-06 MED ORDER — LEVOTHYROXINE SODIUM 50 MCG PO TABS
50.0000 ug | ORAL_TABLET | Freq: Every day | ORAL | 3 refills | Status: AC
  Filled 2024-04-06 – 2024-07-03 (×2): qty 90, 90d supply, fill #0
  Filled 2024-09-29: qty 90, 90d supply, fill #1
  Filled 2024-12-31: qty 90, 90d supply, fill #2

## 2024-04-08 ENCOUNTER — Other Ambulatory Visit (HOSPITAL_COMMUNITY): Payer: Self-pay

## 2024-04-08 ENCOUNTER — Other Ambulatory Visit: Payer: Self-pay

## 2024-04-08 ENCOUNTER — Encounter (HOSPITAL_COMMUNITY): Payer: Self-pay

## 2024-04-08 DIAGNOSIS — F331 Major depressive disorder, recurrent, moderate: Secondary | ICD-10-CM | POA: Diagnosis not present

## 2024-04-08 DIAGNOSIS — F900 Attention-deficit hyperactivity disorder, predominantly inattentive type: Secondary | ICD-10-CM | POA: Diagnosis not present

## 2024-04-08 MED ORDER — SPRAVATO (84 MG DOSE) 28 MG/DEVICE NA SOPK
PACK | NASAL | 0 refills | Status: DC
  Filled 2024-04-08: qty 3, 3d supply, fill #0
  Filled 2024-04-13: qty 3, 4d supply, fill #0

## 2024-04-08 MED ORDER — DYANAVEL XR 20 MG PO TBCR
20.0000 mg | EXTENDED_RELEASE_TABLET | Freq: Every morning | ORAL | 0 refills | Status: DC
  Filled 2024-04-08 – 2024-04-09 (×2): qty 30, 30d supply, fill #0

## 2024-04-08 NOTE — Progress Notes (Signed)
 Specialty Pharmacy Initial Fill Coordination Note  Lindsey Davis is a 63 y.o. female contacted today regarding initial fill of specialty medication(s) Esketamine HCl (Spravato  (84 MG Dose))   Patient requested Courier to Provider Office   Delivery date: 04/12/24 (pending PA)   Verified address: Dr. Percell Boyers office- [address], [address], [place], [place], ATTN: Isa Manuel   Medication will be filled pending PA.

## 2024-04-08 NOTE — Progress Notes (Signed)
 Pharmacy Patient Advocate Encounter   Received notification from Patient Pharmacy that prior authorization for Spravato  is required/requested.   Insurance verification completed.   The patient is insured through [facility] .   Per test claim: PA required; PA submitted to above mentioned insurance via CoverMyMeds Key/confirmation #/EOC [id number] Status is pending

## 2024-04-09 ENCOUNTER — Other Ambulatory Visit (HOSPITAL_COMMUNITY): Payer: Self-pay

## 2024-04-09 ENCOUNTER — Other Ambulatory Visit: Payer: Self-pay

## 2024-04-09 ENCOUNTER — Telehealth: Payer: Self-pay

## 2024-04-09 ENCOUNTER — Other Ambulatory Visit: Payer: Self-pay | Admitting: *Deleted

## 2024-04-09 NOTE — Progress Notes (Signed)
 Pharmacy Patient Advocate Encounter  Received notification from [employer] that Prior Authorization for Spravato  has been DENIED.      PA #/Case ID/Reference #:  [id number]  Denial letter faxed to provider. E-Appeal available on CMM. Key shared with provider also.

## 2024-04-09 NOTE — Telephone Encounter (Signed)
 Copied from CRM [id number]. Topic: Clinical - Medication Question >> Apr 09, 2024 11:41 AM Jethro Morrison wrote: Reason for CRM: PT IS REQUESTING A CALL FROM THE NURSE TO GIVE HER A LIST OF ALL THE ANTIDEPRESSANTS SHE HAS TAKEN IN THE PAST AND MAY HAVE BEEN DISCONTINUED SHE WOULD LIKE A CALL BACK TODAY.  I have spoken with the pt regarding wanting to get a list of all her ANTIDEPRESSANTS SHE HAS TAKEN IN THE PAST AND MAY HAVE BEEN DISCONTINUED. I let her know that I was not sure if we could get a copy of the past/discontinued rx that she has taken but I will get with Keba/hunter to see if this could be done and either myself/keba would be back with her. I have also asked for help from Meyer and Jamie to see if this could be done.

## 2024-04-10 ENCOUNTER — Other Ambulatory Visit (HOSPITAL_COMMUNITY): Payer: Self-pay

## 2024-04-12 ENCOUNTER — Other Ambulatory Visit: Payer: Self-pay

## 2024-04-12 NOTE — Progress Notes (Signed)
 Prescriber has started appeal process for twice weekly dosing. Cancelled initial fill for now. MDO will let us  know once they receive determination how to proceed.

## 2024-04-13 ENCOUNTER — Other Ambulatory Visit (HOSPITAL_COMMUNITY): Payer: Self-pay

## 2024-04-13 ENCOUNTER — Other Ambulatory Visit: Payer: Self-pay

## 2024-04-13 NOTE — Progress Notes (Signed)
 Specialty Pharmacy Initial Fill Coordination Note  Lindsey Davis is a 64 y.o. female contacted today regarding initial fill of specialty medication(s) Esketamine HCl (Spravato  (84 MG Dose))   Patient requested Courier to Provider Office   Delivery date: 04/14/24   Verified address: Dr. Percell Boyers office- [address], [place], [place], ATTN: Isa Manuel   Medication will be filled on 04/14/24. Same day courier.  Signature required.    Patient is aware of $10 copayment.  Patient has a CC on file for payment.   REMs authorization information will be added on the date of dispense.    Appointment is on 04/19/24.

## 2024-04-14 ENCOUNTER — Other Ambulatory Visit: Payer: Self-pay

## 2024-04-14 NOTE — Progress Notes (Signed)
 04/14/24 REMS Authorization: [id number]

## 2024-04-16 ENCOUNTER — Other Ambulatory Visit (HOSPITAL_COMMUNITY): Payer: Self-pay

## 2024-04-16 MED ORDER — AMPHETAMINE-DEXTROAMPHET ER 30 MG PO CP24
30.0000 mg | ORAL_CAPSULE | Freq: Every morning | ORAL | 0 refills | Status: DC
  Filled 2024-04-16: qty 30, 30d supply, fill #0

## 2024-04-16 MED ORDER — AMPHETAMINE-DEXTROAMPHETAMINE 20 MG PO TABS
30.0000 mg | ORAL_TABLET | Freq: Every day | ORAL | 0 refills | Status: DC
  Filled 2024-04-16: qty 45, 30d supply, fill #0

## 2024-04-19 DIAGNOSIS — F332 Major depressive disorder, recurrent severe without psychotic features: Secondary | ICD-10-CM | POA: Diagnosis not present

## 2024-04-20 ENCOUNTER — Other Ambulatory Visit: Payer: Self-pay

## 2024-04-20 ENCOUNTER — Other Ambulatory Visit (HOSPITAL_COMMUNITY): Payer: Self-pay

## 2024-04-20 MED ORDER — SPRAVATO (84 MG DOSE) 28 MG/DEVICE NA SOPK
PACK | NASAL | 0 refills | Status: DC
  Filled 2024-04-20: qty 3, 3d supply, fill #0

## 2024-04-20 NOTE — Progress Notes (Signed)
 Specialty Pharmacy Refill Coordination Note  Lindsey Davis is a 63 y.o. female contacted today regarding refills of specialty medication(s) Esketamine HCl (Spravato  (84 MG Dose))   Patient requested Courier to Provider Office   Delivery date: 04/21/24   Verified address: Dr. Percell Boyers office- [address], [address], [place], [place], ATTN: Isa Manuel   Medication will be filled on 04/21/24.  Same day delivery, signature required, ATTN: Isa Manuel.   Per Concha Deed, pt is tolerating therapy with no issues or concerns.   REMs Authorization information will be added on the date of dispense.

## 2024-04-21 ENCOUNTER — Other Ambulatory Visit: Payer: Self-pay

## 2024-04-21 DIAGNOSIS — F332 Major depressive disorder, recurrent severe without psychotic features: Secondary | ICD-10-CM | POA: Diagnosis not present

## 2024-04-21 NOTE — Progress Notes (Signed)
 Same day courier Ctrl # [id number]

## 2024-04-21 NOTE — Progress Notes (Signed)
 04/21/24 REMS Authorization: Lindsey Davis

## 2024-04-22 ENCOUNTER — Other Ambulatory Visit: Payer: Self-pay

## 2024-04-22 ENCOUNTER — Other Ambulatory Visit (HOSPITAL_COMMUNITY): Payer: Self-pay

## 2024-04-22 MED ORDER — SPRAVATO (84 MG DOSE) 28 MG/DEVICE NA SOPK
PACK | NASAL | 0 refills | Status: DC
  Filled 2024-04-23: qty 3, 3d supply, fill #0

## 2024-04-23 ENCOUNTER — Other Ambulatory Visit: Payer: Self-pay

## 2024-04-23 NOTE — Progress Notes (Signed)
 Specialty Pharmacy Refill Coordination Note  Lindsey Davis is a 63 y.o. female assessed today regarding refills of clinic administered specialty medication(s) Esketamine HCl (Spravato  (84 MG Dose))   Clinic requested Courier to Provider Office   Delivery date: 04/26/24   Verified address: Dr. Percell Boyers office- [address], [place], [place], ATTN: Isa Manuel   Medication will be filled on 04/26/24.   Provider office called and state that patient remains on twice weekly dosing. Rx was only sent for 1 dose. Requested Rx for 2 doses for full week supply and they will check with provider if it can be sent that way. Otherwise they will send new Rx after Monday 5/12 appt and we will deliver next dose on Wednesday 5/14 prior to Thursday appt.

## 2024-04-26 ENCOUNTER — Other Ambulatory Visit: Payer: Self-pay

## 2024-04-26 ENCOUNTER — Other Ambulatory Visit (HOSPITAL_COMMUNITY): Payer: Self-pay

## 2024-04-26 DIAGNOSIS — F332 Major depressive disorder, recurrent severe without psychotic features: Secondary | ICD-10-CM | POA: Diagnosis not present

## 2024-04-26 MED ORDER — AUVELITY 45-105 MG PO TBCR
1.0000 | EXTENDED_RELEASE_TABLET | Freq: Two times a day (BID) | ORAL | 2 refills | Status: DC
  Filled 2024-04-26: qty 60, 30d supply, fill #0

## 2024-04-26 MED ORDER — TRAZODONE HCL 50 MG PO TABS
100.0000 mg | ORAL_TABLET | Freq: Every evening | ORAL | 3 refills | Status: AC
  Filled 2024-04-26 – 2024-07-25 (×2): qty 270, 90d supply, fill #0
  Filled 2024-10-24: qty 270, 90d supply, fill #1
  Filled 2025-01-19: qty 270, 90d supply, fill #2

## 2024-04-26 NOTE — Progress Notes (Signed)
 04/26/24 REMS Authorization: [id number]

## 2024-04-27 ENCOUNTER — Other Ambulatory Visit: Payer: Self-pay

## 2024-04-27 ENCOUNTER — Other Ambulatory Visit (HOSPITAL_COMMUNITY): Payer: Self-pay

## 2024-04-27 MED ORDER — SPRAVATO (84 MG DOSE) 28 MG/DEVICE NA SOPK
PACK | NASAL | 0 refills | Status: DC
  Filled 2024-04-27: qty 6, 7d supply, fill #0

## 2024-04-27 NOTE — Progress Notes (Signed)
 Specialty Pharmacy Refill Coordination Note  Lindsey Davis is a 63 y.o. female contacted today regarding refills of specialty medication(s) Esketamine HCl (Spravato  (84 MG Dose))   Patient requested Courier to Provider Office   Delivery date: 04/28/24   Verified address: Dr. Percell Boyers office- [address], [place], [place], ATTN: Isa Manuel   Medication will be filled on 04/28/24.     Same day courier, signature required, attn: Isa Manuel. REMS Authorization Code to be added on date dispensed.

## 2024-04-28 ENCOUNTER — Other Ambulatory Visit: Payer: Self-pay

## 2024-04-28 NOTE — Progress Notes (Signed)
 04/28/24 REMS Authorization Code: [id number]

## 2024-04-29 DIAGNOSIS — F332 Major depressive disorder, recurrent severe without psychotic features: Secondary | ICD-10-CM | POA: Diagnosis not present

## 2024-04-30 ENCOUNTER — Other Ambulatory Visit: Payer: Self-pay

## 2024-04-30 NOTE — Progress Notes (Signed)
 Patient had appt on 5/15 and used one of the doses we sent last week. Next appt 5/20 and office still has a dose. They will send new RX that we can send on 5/21 for following appt on 5/22.

## 2024-05-01 ENCOUNTER — Other Ambulatory Visit (HOSPITAL_COMMUNITY): Payer: Self-pay

## 2024-05-04 ENCOUNTER — Other Ambulatory Visit: Payer: Self-pay

## 2024-05-04 DIAGNOSIS — F332 Major depressive disorder, recurrent severe without psychotic features: Secondary | ICD-10-CM | POA: Diagnosis not present

## 2024-05-05 ENCOUNTER — Other Ambulatory Visit: Payer: Self-pay

## 2024-05-05 ENCOUNTER — Other Ambulatory Visit (HOSPITAL_COMMUNITY): Payer: Self-pay

## 2024-05-05 MED ORDER — SPRAVATO (84 MG DOSE) 28 MG/DEVICE NA SOPK
PACK | NASAL | 0 refills | Status: DC
  Filled 2024-05-05: qty 6, 7d supply, fill #0

## 2024-05-05 MED ORDER — SPRAVATO (84 MG DOSE) 28 MG/DEVICE NA SOPK
PACK | NASAL | 0 refills | Status: DC
  Filled 2024-05-11: qty 6, 7d supply, fill #0

## 2024-05-05 NOTE — Progress Notes (Signed)
 Same-Day courier (signature required) sending 05/05/24.  Control Number: [id number]

## 2024-05-05 NOTE — Progress Notes (Signed)
 Specialty Pharmacy Refill Coordination Note  Lindsey Davis is a 63 y.o. female contacted today regarding refills of specialty medication(s) Esketamine HCl (Spravato  (84 MG Dose))   Patient requested Courier to Provider Office   Delivery date: 05/05/24   Verified address: Dr. Percell Boyers office- [address], [place], [place], ATTN: Isa Manuel   Medication will be filled on 05/05/24.

## 2024-05-05 NOTE — Progress Notes (Signed)
 05/05/24 REMS Authorization Code: [id number]

## 2024-05-06 DIAGNOSIS — F332 Major depressive disorder, recurrent severe without psychotic features: Secondary | ICD-10-CM | POA: Diagnosis not present

## 2024-05-07 ENCOUNTER — Ambulatory Visit
Admission: RE | Admit: 2024-05-07 | Discharge: 2024-05-07 | Disposition: A | Discharge status: Home / Self Care | Source: Ambulatory Visit | Attending: Family Medicine | Admitting: Family Medicine

## 2024-05-07 DIAGNOSIS — Z1231 Encounter for screening mammogram for malignant neoplasm of breast: Secondary | ICD-10-CM | POA: Diagnosis not present

## 2024-05-11 ENCOUNTER — Other Ambulatory Visit: Payer: Self-pay

## 2024-05-11 DIAGNOSIS — F332 Major depressive disorder, recurrent severe without psychotic features: Secondary | ICD-10-CM | POA: Diagnosis not present

## 2024-05-11 NOTE — Progress Notes (Signed)
 Specialty Pharmacy Refill Coordination Note  Lindsey Davis is a 63 y.o. female contacted today regarding refills of specialty medication(s) Esketamine HCl (Spravato  (84 MG Dose))   Patient requested Courier to Provider Office   Delivery date: 05/11/24   Verified address: Dr. Percell Boyers office- [address], [place], [place], ATTN: Isa Manuel   Medication will be filled on 05/11/24.    Same day courier, signature required, attn: Isa Manuel  05/11/24 REMS Authorization Code: [id number]

## 2024-05-12 ENCOUNTER — Telehealth

## 2024-05-12 ENCOUNTER — Telehealth: Admitting: Family Medicine

## 2024-05-12 ENCOUNTER — Other Ambulatory Visit (HOSPITAL_COMMUNITY): Payer: Self-pay

## 2024-05-12 DIAGNOSIS — L259 Unspecified contact dermatitis, unspecified cause: Secondary | ICD-10-CM

## 2024-05-12 DIAGNOSIS — F332 Major depressive disorder, recurrent severe without psychotic features: Secondary | ICD-10-CM | POA: Diagnosis not present

## 2024-05-12 MED ORDER — TRIAMCINOLONE ACETONIDE 0.1 % EX CREA
1.0000 | TOPICAL_CREAM | Freq: Two times a day (BID) | CUTANEOUS | 0 refills | Status: DC
  Filled 2024-05-12: qty 30, 15d supply, fill #0

## 2024-05-12 NOTE — Progress Notes (Signed)
 E Visit for Rash  We are sorry that you are not feeling well. Here is how we plan to help!  Based on what you shared with me it looks like you have contact dermatitis.  Contact dermatitis is a skin rash caused by something that touches the skin and causes irritation or inflammation.  Your skin may be red, swollen, dry, cracked, and itch.  The rash should go away in a few days but can last a few weeks.  If you get a rash, it's important to figure out what caused it so the irritant can be avoided in the future.  I will order a topical Kenalog to help get the area better controlled and prevent ongoing spreading.    HOME CARE:  Take cool showers and avoid direct sunlight. Apply cool compress or wet dressings. Take a bath in an oatmeal bath.  Sprinkle content of one Aveeno packet under running faucet with comfortably warm water.  Bathe for 15-20 minutes, 1-2 times daily.  Pat dry with a towel. Do not rub the rash. Use hydrocortisone cream. Take an antihistamine like Benadryl for widespread rashes that itch.  The adult dose of Benadryl is 25-50 mg by mouth 4 times daily. Caution:  This type of medication may cause sleepiness.  Do not drink alcohol, drive, or operate dangerous machinery while taking antihistamines.  Do not take these medications if you have prostate enlargement.  Read package instructions thoroughly on all medications that you take.  GET HELP RIGHT AWAY IF:  Symptoms don't go away after treatment. Severe itching that persists. If you rash spreads or swells. If you rash begins to smell. If it blisters and opens or develops a yellow-brown crust. You develop a fever. You have a sore throat. You become short of breath.  MAKE SURE YOU:  Understand these instructions. Will watch your condition. Will get help right away if you are not doing well or get worse.  Thank you for choosing an e-visit.  Your e-visit answers were reviewed by a board certified advanced clinical  practitioner to complete your personal care plan. Depending upon the condition, your plan could have included both over the counter or prescription medications.  Please review your pharmacy choice. Make sure the pharmacy is open so you can pick up prescription now. If there is a problem, you may contact your provider through [employer] and have the prescription routed to another pharmacy.  Your safety is important to us . If you have drug allergies check your prescription carefully.   For the next 24 hours you can use MyChart to ask questions about today's visit, request a non-urgent call back, or ask for a work or school excuse. You will get an email in the next two days asking about your experience. I hope that your e-visit has been valuable and will speed your recovery.  I provided 5 minutes of non face-to-face time during this encounter for chart review, medication and order placement, as well as and documentation.

## 2024-05-13 ENCOUNTER — Telehealth: Payer: Self-pay

## 2024-05-13 ENCOUNTER — Other Ambulatory Visit: Payer: Self-pay

## 2024-05-13 ENCOUNTER — Other Ambulatory Visit: Payer: Self-pay | Admitting: Family Medicine

## 2024-05-13 DIAGNOSIS — R928 Other abnormal and inconclusive findings on diagnostic imaging of breast: Secondary | ICD-10-CM

## 2024-05-13 NOTE — Telephone Encounter (Signed)
 See below, unsure how to order a breast xray.  Copied from CRM [id number]. Topic: Clinical - Request for Lab/Test Order >> May 13, 2024 10:06 AM Lindsey Davis wrote: Reason for CRM: Patient stated she will need a follow up x-ray to be ordered of her right breast. She would like this completed at the [facility] in [place], fax number for that facility is [phone].

## 2024-05-13 NOTE — Telephone Encounter (Signed)
 Can you check with the order placed on 5/29 under imaging-I think this was already ordered by  the [facility] Center

## 2024-05-14 ENCOUNTER — Other Ambulatory Visit (HOSPITAL_COMMUNITY): Payer: Self-pay

## 2024-05-14 ENCOUNTER — Other Ambulatory Visit: Payer: Self-pay | Admitting: Family Medicine

## 2024-05-14 ENCOUNTER — Encounter: Payer: Self-pay | Admitting: Family Medicine

## 2024-05-14 DIAGNOSIS — R928 Other abnormal and inconclusive findings on diagnostic imaging of breast: Secondary | ICD-10-CM

## 2024-05-14 DIAGNOSIS — F900 Attention-deficit hyperactivity disorder, predominantly inattentive type: Secondary | ICD-10-CM | POA: Diagnosis not present

## 2024-05-14 DIAGNOSIS — R921 Mammographic calcification found on diagnostic imaging of breast: Secondary | ICD-10-CM

## 2024-05-14 DIAGNOSIS — F3341 Major depressive disorder, recurrent, in partial remission: Secondary | ICD-10-CM | POA: Diagnosis not present

## 2024-05-14 MED ORDER — AMPHETAMINE-DEXTROAMPHET ER 30 MG PO CP24
30.0000 mg | ORAL_CAPSULE | Freq: Every morning | ORAL | 0 refills | Status: DC
  Filled 2024-05-17: qty 30, 30d supply, fill #0

## 2024-05-14 MED ORDER — TRINTELLIX 20 MG PO TABS
20.0000 mg | ORAL_TABLET | Freq: Every day | ORAL | 3 refills | Status: AC
  Filled 2024-05-14 – 2024-07-31 (×3): qty 90, 90d supply, fill #0
  Filled 2024-10-24: qty 90, 90d supply, fill #1

## 2024-05-14 MED ORDER — AMPHETAMINE-DEXTROAMPHETAMINE 20 MG PO TABS
30.0000 mg | ORAL_TABLET | Freq: Every day | ORAL | 0 refills | Status: DC
  Filled 2024-05-17: qty 45, 30d supply, fill #0

## 2024-05-14 NOTE — Telephone Encounter (Signed)
 Order corrected per [facility] request; advised they will contact pt directly to schedule. Returned pt call and pt verbalized understanding. Nothing further needed at this time.

## 2024-05-14 NOTE — Telephone Encounter (Signed)
 Patient called back and stated yes she wants the order moved to [facility] she would like a call back regarding this

## 2024-05-14 NOTE — Telephone Encounter (Signed)
 Kela -thank you for your help.  I have cosigned orders

## 2024-05-14 NOTE — Telephone Encounter (Signed)
 Pt already scheduled 05/17/24 at [facility], called [facility] to attempt to move appt in their office. Was advised order placed need to be corrected with code [id number]. Once corrected they will reach out to patient to move appt. Called pt to ensure she wanted this moved to [facility] before changing order; lvm for patient to return our call.

## 2024-05-17 ENCOUNTER — Encounter

## 2024-05-17 ENCOUNTER — Other Ambulatory Visit (HOSPITAL_COMMUNITY): Payer: Self-pay

## 2024-05-17 NOTE — Telephone Encounter (Signed)
 Please read below message about the order. Thank you.

## 2024-05-19 DIAGNOSIS — F332 Major depressive disorder, recurrent severe without psychotic features: Secondary | ICD-10-CM | POA: Diagnosis not present

## 2024-05-20 ENCOUNTER — Ambulatory Visit
Admission: RE | Admit: 2024-05-20 | Discharge: 2024-05-20 | Disposition: A | Discharge status: Home / Self Care | Source: Ambulatory Visit | Attending: Family Medicine | Admitting: Family Medicine

## 2024-05-20 ENCOUNTER — Other Ambulatory Visit: Payer: Self-pay

## 2024-05-20 ENCOUNTER — Other Ambulatory Visit (HOSPITAL_COMMUNITY): Payer: Self-pay

## 2024-05-20 DIAGNOSIS — R928 Other abnormal and inconclusive findings on diagnostic imaging of breast: Secondary | ICD-10-CM | POA: Diagnosis not present

## 2024-05-20 DIAGNOSIS — R92343 Mammographic extreme density, bilateral breasts: Secondary | ICD-10-CM | POA: Diagnosis not present

## 2024-05-20 DIAGNOSIS — R921 Mammographic calcification found on diagnostic imaging of breast: Secondary | ICD-10-CM | POA: Diagnosis not present

## 2024-05-21 ENCOUNTER — Other Ambulatory Visit: Payer: Self-pay

## 2024-05-21 MED ORDER — SPRAVATO (84 MG DOSE) 28 MG/DEVICE NA SOPK
PACK | NASAL | 0 refills | Status: DC
  Filled 2024-05-21: qty 3, 7d supply, fill #0

## 2024-05-21 NOTE — Progress Notes (Signed)
 Specialty Pharmacy Refill Coordination Note  Lindsey Davis is a 63 y.o. female assessed today regarding refills of clinic administered specialty medication(s) Esketamine HCl (Spravato  (84 MG Dose))   Clinic requested Courier to Provider Office   Delivery date: 05/24/24   Verified address: Dr. Percell Boyers office- [address], [address], [place], [place], ATTN: Isa Manuel   Medication will be filled on 05/24/24.

## 2024-05-24 ENCOUNTER — Other Ambulatory Visit: Payer: Self-pay

## 2024-05-24 NOTE — Progress Notes (Signed)
 Spravato  Control number: [id number]

## 2024-05-24 NOTE — Progress Notes (Signed)
 05/24/24 REMS Authorization Code: [id number]

## 2024-05-28 ENCOUNTER — Other Ambulatory Visit: Payer: Self-pay

## 2024-05-28 MED ORDER — SPRAVATO (84 MG DOSE) 28 MG/DEVICE NA SOPK
PACK | NASAL | 0 refills | Status: DC
  Filled 2024-05-28: qty 3, 7d supply, fill #0

## 2024-05-28 NOTE — Progress Notes (Signed)
 Specialty Pharmacy Refill Coordination Note  Inga Noller is a 63 y.o. female assessed today regarding refills of clinic administered specialty medication(s) Esketamine HCl (Spravato  (84 MG Dose))   Clinic requested Courier to Provider Office   Delivery date: 05/31/24   Verified address: Dr. Percell Boyers office- [address], [address], [place], [place], ATTN: Isa Manuel   Medication will be filled on 05/31/24.

## 2024-05-29 DIAGNOSIS — F332 Major depressive disorder, recurrent severe without psychotic features: Secondary | ICD-10-CM | POA: Diagnosis not present

## 2024-05-31 ENCOUNTER — Other Ambulatory Visit (HOSPITAL_COMMUNITY): Payer: Self-pay

## 2024-05-31 ENCOUNTER — Other Ambulatory Visit: Payer: Self-pay

## 2024-05-31 NOTE — Progress Notes (Signed)
 Same-Day courier (signature required) sending 05/31/24.  Control Number: [id number]

## 2024-05-31 NOTE — Progress Notes (Signed)
 05/31/24 REMS Authorization Code: [id number]

## 2024-06-04 ENCOUNTER — Other Ambulatory Visit: Payer: Self-pay

## 2024-06-04 DIAGNOSIS — F332 Major depressive disorder, recurrent severe without psychotic features: Secondary | ICD-10-CM | POA: Diagnosis not present

## 2024-06-04 MED ORDER — SPRAVATO (84 MG DOSE) 28 MG/DEVICE NA SOPK
PACK | NASAL | 0 refills | Status: DC
  Filled 2024-06-04: qty 3, 7d supply, fill #0

## 2024-06-04 NOTE — Progress Notes (Signed)
 Specialty Pharmacy Refill Coordination Note  Lindsey Davis is a 63 y.o. female contacted today regarding refills of specialty medication(s) Esketamine HCl (Spravato  (84 MG Dose))   Patient requested Courier to Provider Office   Delivery date: 06/07/24   Verified address: Dr. Percell Boyers office- [address], [place], [place], ATTN: Isa Manuel   Medication will be filled on 06/07/24.     Same day courier, signature required. REMS Authorization to be added on date dispensed.

## 2024-06-07 ENCOUNTER — Other Ambulatory Visit: Payer: Self-pay

## 2024-06-07 NOTE — Progress Notes (Signed)
 06/07/24 REMS Authorization Code: [id number]

## 2024-06-08 ENCOUNTER — Other Ambulatory Visit: Payer: Self-pay | Admitting: Internal Medicine

## 2024-06-09 ENCOUNTER — Other Ambulatory Visit (HOSPITAL_COMMUNITY): Payer: Self-pay

## 2024-06-09 MED ORDER — ALENDRONATE SODIUM 70 MG PO TABS
70.0000 mg | ORAL_TABLET | ORAL | 0 refills | Status: DC
  Filled 2024-06-09: qty 12, 84d supply, fill #0

## 2024-06-11 ENCOUNTER — Other Ambulatory Visit: Payer: Self-pay

## 2024-06-11 ENCOUNTER — Other Ambulatory Visit (HOSPITAL_COMMUNITY): Payer: Self-pay

## 2024-06-11 DIAGNOSIS — F332 Major depressive disorder, recurrent severe without psychotic features: Secondary | ICD-10-CM | POA: Diagnosis not present

## 2024-06-14 ENCOUNTER — Other Ambulatory Visit: Payer: Self-pay

## 2024-06-14 MED ORDER — SPRAVATO (84 MG DOSE) 28 MG/DEVICE NA SOPK
PACK | NASAL | 0 refills | Status: DC
  Filled 2024-06-14: qty 3, 7d supply, fill #0

## 2024-06-14 NOTE — Progress Notes (Signed)
 Specialty Pharmacy Refill Coordination Note  Tynika Luddy is a 63 y.o. female assessed today regarding refills of clinic administered specialty medication(s) Esketamine HCl (Spravato  (84 MG Dose))   Clinic requested Courier to Provider Office   Delivery date: 06/14/24   Verified address: Dr. Jonothan office- [address], [place], [place], ATTN: Lauraine   Medication will be filled on 06/14/24.

## 2024-06-14 NOTE — Progress Notes (Signed)
 06/14/24 REMS Authorization Code: [id number]

## 2024-06-17 ENCOUNTER — Other Ambulatory Visit (HOSPITAL_COMMUNITY): Payer: Self-pay

## 2024-06-17 ENCOUNTER — Other Ambulatory Visit: Payer: Self-pay

## 2024-06-17 DIAGNOSIS — F3342 Major depressive disorder, recurrent, in full remission: Secondary | ICD-10-CM | POA: Diagnosis not present

## 2024-06-17 DIAGNOSIS — F900 Attention-deficit hyperactivity disorder, predominantly inattentive type: Secondary | ICD-10-CM | POA: Diagnosis not present

## 2024-06-17 MED ORDER — AMPHET-DEXTROAMPHET 3-BEAD ER 50 MG PO CP24
50.0000 mg | ORAL_CAPSULE | Freq: Every morning | ORAL | 0 refills | Status: DC
  Filled 2024-06-17: qty 30, 30d supply, fill #0
  Filled 2024-06-21: qty 26, 26d supply, fill #0
  Filled 2024-06-21: qty 4, 4d supply, fill #0

## 2024-06-19 ENCOUNTER — Other Ambulatory Visit (HOSPITAL_COMMUNITY): Payer: Self-pay

## 2024-06-21 ENCOUNTER — Other Ambulatory Visit (HOSPITAL_COMMUNITY): Payer: Self-pay

## 2024-06-21 ENCOUNTER — Other Ambulatory Visit: Payer: Self-pay

## 2024-06-25 ENCOUNTER — Other Ambulatory Visit: Payer: Self-pay

## 2024-06-25 DIAGNOSIS — F332 Major depressive disorder, recurrent severe without psychotic features: Secondary | ICD-10-CM | POA: Diagnosis not present

## 2024-06-26 ENCOUNTER — Other Ambulatory Visit (HOSPITAL_BASED_OUTPATIENT_CLINIC_OR_DEPARTMENT_OTHER): Payer: Self-pay

## 2024-07-02 ENCOUNTER — Other Ambulatory Visit: Payer: Self-pay

## 2024-07-02 MED ORDER — SPRAVATO (84 MG DOSE) 28 MG/DEVICE NA SOPK
PACK | NASAL | 0 refills | Status: DC
  Filled 2024-07-07: qty 3, 7d supply, fill #0

## 2024-07-03 ENCOUNTER — Other Ambulatory Visit (HOSPITAL_BASED_OUTPATIENT_CLINIC_OR_DEPARTMENT_OTHER): Payer: Self-pay

## 2024-07-07 ENCOUNTER — Other Ambulatory Visit: Payer: Self-pay

## 2024-07-07 NOTE — Progress Notes (Signed)
 Specialty Pharmacy Refill Coordination Note  Lindsey Davis is a 63 y.o. female contacted today regarding refills of specialty medication(s) Esketamine HCl (Spravato  (84 MG Dose))   Patient requested Courier to Provider Office   Delivery date: 07/08/24   Verified address: Dr. Jonothan office- [address], [place], [place], ATTN: Lauraine   Medication will be filled on 07/08/24. Same day courier, signature required.   REMs authorization information will be added on the date dispensed.   Patient's appointment is 07/12/24 and per RN she continues to tolerate therapy with no issues or concerns.

## 2024-07-08 ENCOUNTER — Other Ambulatory Visit: Payer: Self-pay

## 2024-07-08 NOTE — Progress Notes (Signed)
 Dispensed same day courier on 07/08/24, signature required.  REMS Authorization code: [id number]

## 2024-07-16 ENCOUNTER — Other Ambulatory Visit: Payer: Self-pay

## 2024-07-19 ENCOUNTER — Other Ambulatory Visit: Payer: Self-pay

## 2024-07-19 MED ORDER — SPRAVATO (84 MG DOSE) 28 MG/DEVICE NA SOPK
PACK | NASAL | 0 refills | Status: DC
  Filled 2024-07-19: qty 3, 7d supply, fill #0

## 2024-07-19 NOTE — Progress Notes (Signed)
 Specialty Pharmacy Refill Coordination Note  Lindsey Davis is a 63 y.o. female contacted today regarding refills of specialty medication(s) Esketamine HCl (Spravato  (84 MG Dose))   Patient requested Courier to Provider Office   Delivery date: 07/21/24   Verified address: Dr. Jonothan office- [address], [place], [place], ATTN: Lauraine   Medication will be filled on 07/21/24.   Appt 8/9

## 2024-07-20 ENCOUNTER — Other Ambulatory Visit: Payer: Self-pay

## 2024-07-20 ENCOUNTER — Other Ambulatory Visit (HOSPITAL_COMMUNITY): Payer: Self-pay

## 2024-07-21 ENCOUNTER — Other Ambulatory Visit: Payer: Self-pay

## 2024-07-21 ENCOUNTER — Other Ambulatory Visit (HOSPITAL_BASED_OUTPATIENT_CLINIC_OR_DEPARTMENT_OTHER): Payer: Self-pay

## 2024-07-21 MED ORDER — AMPHET-DEXTROAMPHET 3-BEAD ER 50 MG PO CP24
50.0000 mg | ORAL_CAPSULE | Freq: Every morning | ORAL | 0 refills | Status: DC
  Filled 2024-07-21: qty 30, 30d supply, fill #0

## 2024-07-21 NOTE — Progress Notes (Signed)
 07/21/24 REMS Auth Code: [id number]

## 2024-07-24 ENCOUNTER — Other Ambulatory Visit (HOSPITAL_COMMUNITY): Payer: Self-pay

## 2024-07-24 ENCOUNTER — Other Ambulatory Visit (HOSPITAL_BASED_OUTPATIENT_CLINIC_OR_DEPARTMENT_OTHER): Payer: Self-pay

## 2024-07-24 DIAGNOSIS — F332 Major depressive disorder, recurrent severe without psychotic features: Secondary | ICD-10-CM | POA: Diagnosis not present

## 2024-07-25 ENCOUNTER — Other Ambulatory Visit: Payer: Self-pay | Admitting: Internal Medicine

## 2024-07-26 ENCOUNTER — Other Ambulatory Visit (HOSPITAL_COMMUNITY): Payer: Self-pay

## 2024-07-26 ENCOUNTER — Other Ambulatory Visit: Payer: Self-pay

## 2024-07-26 ENCOUNTER — Other Ambulatory Visit (HOSPITAL_BASED_OUTPATIENT_CLINIC_OR_DEPARTMENT_OTHER): Payer: Self-pay

## 2024-07-26 DIAGNOSIS — F332 Major depressive disorder, recurrent severe without psychotic features: Secondary | ICD-10-CM | POA: Diagnosis not present

## 2024-07-26 MED ORDER — SPRAVATO (84 MG DOSE) 28 MG/DEVICE NA SOPK
PACK | NASAL | 0 refills | Status: DC
  Filled 2024-07-26: qty 3, 7d supply, fill #0

## 2024-07-26 MED ORDER — ALENDRONATE SODIUM 70 MG PO TABS
70.0000 mg | ORAL_TABLET | ORAL | 0 refills | Status: DC
  Filled 2024-07-26 – 2024-08-22 (×2): qty 12, 84d supply, fill #0

## 2024-07-26 NOTE — Progress Notes (Signed)
 07/26/24 REMs Authorization code: [id number]

## 2024-07-26 NOTE — Progress Notes (Signed)
 Same-Day courier (signature required) sending 07/26/24.  Control Number: [id number]

## 2024-07-26 NOTE — Progress Notes (Signed)
 Specialty Pharmacy Refill Coordination Note  Lindsey Davis is a 63 y.o. female assessed today regarding refills of clinic administered specialty medication(s) Esketamine HCl (Spravato  (84 MG Dose))   Clinic requested Courier to Provider Office   Delivery date: 07/26/24   Verified address: Dr. Jonothan office- [address], [address], [place], [place], ATTN: Lauraine   Medication will be filled on 07/26/24. Same day courier, signature required.   Healthcare setting DEA# [id number]   Per RN, the patient continued to tolerate treatment with no issues or concerns.

## 2024-07-27 ENCOUNTER — Other Ambulatory Visit: Payer: Self-pay

## 2024-07-27 MED ORDER — SPRAVATO (84 MG DOSE) 28 MG/DEVICE NA SOPK
PACK | NASAL | 0 refills | Status: DC
  Filled 2024-07-30: qty 3, 7d supply, fill #0

## 2024-07-30 ENCOUNTER — Other Ambulatory Visit: Payer: Self-pay

## 2024-07-30 NOTE — Progress Notes (Signed)
 Specialty Pharmacy Refill Coordination Note  Lindsey Davis is a 62 y.o. female assessed today regarding refills of clinic administered specialty medication(s) Esketamine HCl (Spravato  (84 MG Dose))   Clinic requested Courier to Provider Office   Delivery date: 08/02/24   Verified address: Dr. Jonothan office- [address], [address], [place], [place], ATTN: Lauraine   Medication will be filled on 08/02/24.    Appt 8/22

## 2024-07-31 ENCOUNTER — Other Ambulatory Visit (HOSPITAL_BASED_OUTPATIENT_CLINIC_OR_DEPARTMENT_OTHER): Payer: Self-pay

## 2024-08-02 ENCOUNTER — Other Ambulatory Visit: Payer: Self-pay

## 2024-08-02 NOTE — Progress Notes (Signed)
 08/02/24 REMS Authorization Code: [id number]; same day courier, signature required

## 2024-08-05 ENCOUNTER — Other Ambulatory Visit (HOSPITAL_BASED_OUTPATIENT_CLINIC_OR_DEPARTMENT_OTHER): Payer: Self-pay

## 2024-08-06 ENCOUNTER — Other Ambulatory Visit (HOSPITAL_BASED_OUTPATIENT_CLINIC_OR_DEPARTMENT_OTHER): Payer: Self-pay

## 2024-08-06 ENCOUNTER — Other Ambulatory Visit: Payer: Self-pay

## 2024-08-06 DIAGNOSIS — F332 Major depressive disorder, recurrent severe without psychotic features: Secondary | ICD-10-CM | POA: Diagnosis not present

## 2024-08-06 MED ORDER — SPRAVATO (84 MG DOSE) 28 MG/DEVICE NA SOPK
PACK | NASAL | 0 refills | Status: DC
  Filled 2024-08-11: qty 3, 7d supply, fill #0

## 2024-08-11 ENCOUNTER — Other Ambulatory Visit: Payer: Self-pay

## 2024-08-11 NOTE — Progress Notes (Signed)
 Specialty Pharmacy Refill Coordination Note  Elmire Amrein is a 63 y.o. female contacted today regarding refills of specialty medication(s) Esketamine HCl (Spravato  (84 MG Dose))   Patient requested Courier to Provider Office   Delivery date: 08/13/24   Verified address: Dr. Jonothan office- [address], [place], [place], ATTN: Lauraine   Medication will be filled on 08/13/24.   Same day courier, signature required, REMS Authorization Code to be added on date dispensed.

## 2024-08-13 ENCOUNTER — Other Ambulatory Visit: Payer: Self-pay

## 2024-08-13 NOTE — Progress Notes (Signed)
 08/13/24 REMS Authorization Code: [id number]

## 2024-08-19 ENCOUNTER — Other Ambulatory Visit: Payer: Self-pay

## 2024-08-19 ENCOUNTER — Other Ambulatory Visit (HOSPITAL_BASED_OUTPATIENT_CLINIC_OR_DEPARTMENT_OTHER): Payer: Self-pay

## 2024-08-19 DIAGNOSIS — F332 Major depressive disorder, recurrent severe without psychotic features: Secondary | ICD-10-CM | POA: Diagnosis not present

## 2024-08-19 MED ORDER — SPRAVATO (84 MG DOSE) 28 MG/DEVICE NA SOPK
PACK | NASAL | 0 refills | Status: DC
  Filled 2024-08-20: qty 3, 7d supply, fill #0

## 2024-08-19 MED ORDER — AMPHET-DEXTROAMPHET 3-BEAD ER 50 MG PO CP24
50.0000 mg | ORAL_CAPSULE | Freq: Every morning | ORAL | 0 refills | Status: DC
  Filled 2024-08-20: qty 30, 30d supply, fill #0

## 2024-08-20 ENCOUNTER — Other Ambulatory Visit (HOSPITAL_BASED_OUTPATIENT_CLINIC_OR_DEPARTMENT_OTHER): Payer: Self-pay

## 2024-08-20 ENCOUNTER — Other Ambulatory Visit: Payer: Self-pay

## 2024-08-20 NOTE — Progress Notes (Signed)
 Specialty Pharmacy Refill Coordination Note  Lindsey Davis is a 63 y.o. female assessed today regarding refills of clinic administered specialty medication(s) Esketamine HCl (Spravato  (84 MG Dose))   Clinic requested Courier to Provider Office   Delivery date: 08/23/24   Verified address: Dr. Jonothan office- [address], [place], [place], ATTN: Lauraine   Medication will be filled on 08/23/24.   Appt 08/23/24 @ 2:30pm, advised office that we will try to have delivery there as early as possible but cannot promise it will be there by this time.

## 2024-08-23 ENCOUNTER — Other Ambulatory Visit (HOSPITAL_BASED_OUTPATIENT_CLINIC_OR_DEPARTMENT_OTHER): Payer: Self-pay

## 2024-08-23 ENCOUNTER — Other Ambulatory Visit (HOSPITAL_COMMUNITY): Payer: Self-pay

## 2024-08-23 ENCOUNTER — Other Ambulatory Visit: Payer: Self-pay

## 2024-08-23 NOTE — Progress Notes (Signed)
 Same-Day courier (signature required) sending 08/23/24.  Control Number: [id number]

## 2024-08-23 NOTE — Progress Notes (Signed)
 08/23/24 REMs Authorization Code: [id number]  Dispensed on 08/23/24 same day courier, signature required.

## 2024-08-25 ENCOUNTER — Other Ambulatory Visit (HOSPITAL_BASED_OUTPATIENT_CLINIC_OR_DEPARTMENT_OTHER): Payer: Self-pay

## 2024-08-27 ENCOUNTER — Other Ambulatory Visit: Payer: Self-pay

## 2024-08-28 ENCOUNTER — Other Ambulatory Visit (HOSPITAL_BASED_OUTPATIENT_CLINIC_OR_DEPARTMENT_OTHER): Payer: Self-pay

## 2024-09-03 ENCOUNTER — Other Ambulatory Visit: Payer: Self-pay

## 2024-09-03 MED ORDER — SPRAVATO (84 MG DOSE) 28 MG/DEVICE NA SOPK
PACK | NASAL | 0 refills | Status: DC
  Filled 2024-09-03: qty 3, 7d supply, fill #0

## 2024-09-03 NOTE — Progress Notes (Signed)
 Specialty Pharmacy Refill Coordination Note  Lindsey Davis is a 63 y.o. female assessed today regarding refills of clinic administered specialty medication(s) Esketamine HCl (Spravato  (84 MG Dose))   Clinic requested Courier to Provider Office   Delivery date: 09/06/24   Verified address: Dr. Jonothan office- [address], [place], [place], ATTN: Lauraine   Medication will be filled on 09/06/24.   Appt 9/24

## 2024-09-04 DIAGNOSIS — F332 Major depressive disorder, recurrent severe without psychotic features: Secondary | ICD-10-CM | POA: Diagnosis not present

## 2024-09-06 ENCOUNTER — Other Ambulatory Visit: Payer: Self-pay

## 2024-09-06 NOTE — Progress Notes (Signed)
 09/06/24 REMS Authorization Code: [id number]; dispensed same day delivery, signature required.

## 2024-09-08 ENCOUNTER — Other Ambulatory Visit: Payer: Self-pay

## 2024-09-08 DIAGNOSIS — F332 Major depressive disorder, recurrent severe without psychotic features: Secondary | ICD-10-CM | POA: Diagnosis not present

## 2024-09-08 MED ORDER — SPRAVATO (84 MG DOSE) 28 MG/DEVICE NA SOPK
PACK | NASAL | 0 refills | Status: DC
  Filled 2024-09-10: qty 3, 7d supply, fill #0

## 2024-09-09 ENCOUNTER — Other Ambulatory Visit (HOSPITAL_BASED_OUTPATIENT_CLINIC_OR_DEPARTMENT_OTHER): Payer: Self-pay

## 2024-09-09 ENCOUNTER — Ambulatory Visit: Payer: 59 | Admitting: Internal Medicine

## 2024-09-09 ENCOUNTER — Encounter: Payer: Self-pay | Admitting: Internal Medicine

## 2024-09-09 VITALS — BP 114/72 | HR 78 | Ht 64.5 in | Wt 170.0 lb

## 2024-09-09 DIAGNOSIS — R82994 Hypercalciuria: Secondary | ICD-10-CM | POA: Diagnosis not present

## 2024-09-09 DIAGNOSIS — M810 Age-related osteoporosis without current pathological fracture: Secondary | ICD-10-CM | POA: Diagnosis not present

## 2024-09-09 MED ORDER — ALENDRONATE SODIUM 70 MG PO TABS
70.0000 mg | ORAL_TABLET | ORAL | 3 refills | Status: DC
  Filled 2024-09-09: qty 12, 84d supply, fill #0

## 2024-09-09 NOTE — Progress Notes (Unsigned)
 Name: Lindsey Davis  MRN/ DOB: [id number], 06/05/1961    Age/ Sex: 63 y.o., female    PCP: Katrinka Garnette KIDD, MD   Reason for Endocrinology Evaluation: Osteoporosis      Date of Initial Endocrinology Evaluation: 03/04/2022    HPI: Lindsey Davis is a 63 y.o. female with a past medical history of Osteoporosis, Hypothyroidism . The patient presented for initial endocrinology clinic visit on 03/04/2022  for consultative assistance with her Osteoporosis .   Pt was diagnosed with osteoporosis: 11/2021 with a T-score -3.6 at the spine   Menarche at age : does not recall  Menopausal at age : at age  31 Fracture Hx: while skating - right wrist fracture  Hx of HRT: no  FH of osteoporosis or hip fracture: no Prior Hx of anti-estrogenic therapy :no  Prior Hx of anti-resorptive therapy : Started alendronate  in March 2023   On her initial visit to our clinic she had normal GFR, serum calcium, phosphorus, magnesium and a vitamin D  of 63.56 NG/mL, PTH normal at 35 PG/mL, normal TFTs but she has been noted with elevated 24-hour urinary calcium excretion at 330 mg   SUBJECTIVE:    Today (09/09/24): Lindsey Davis is here for a follow up on osteoporosis and hypercalciuria.  No heartburn  No constipation or diarrhea  No recent falls  She started running in May, 2025, did 5K with rare lower back pain    Alendronate  70 mg weekly Oyster Calcium 500 + Vit D 5 mcg( takes 2 tabs )  She is also on MVI    gummies  Vitamin D  2000 iu daily    HISTORY:  Past Medical History:  Past Medical History:  Diagnosis Date   Allergy    Anemia    Asthma    Fibrocystic breast    Iron deficiency anemia 09/20/2010   Resolved post menopause.     Past Surgical History:  Past Surgical History:  Procedure Laterality Date   COLONOSCOPY     WRIST SURGERY      Social History:  reports that she quit smoking about 25 years ago. Her smoking use included cigarettes. She started smoking about 35 years ago.  She has a 10 pack-year smoking history. She has never used smokeless tobacco. She reports that she does not drink alcohol and does not use drugs. Family History: family history includes Breast cancer in her mother and sister; Cancer in her paternal grandmother; Heart attack in her father; Urinary tract infection in her mother.   HOME MEDICATIONS: Allergies as of 09/09/2024   No Known Allergies      Medication List        Accurate as of September 09, 2024  7:51 AM. If you have any questions, ask your nurse or doctor.          acetaminophen 500 MG tablet Commonly known as: TYLENOL Take 500 mg by mouth every 6 (six) hours as needed.   albuterol  108 (90 Base) MCG/ACT inhaler Commonly known as: VENTOLIN  HFA Inhale into the lungs as directed.   alendronate  70 MG tablet Commonly known as: FOSAMAX  Take 1 tablet (70 mg total) by mouth every 7 (seven) days. Take with a full glass of water on an empty stomach.   alendronate  70 MG tablet Commonly known as: FOSAMAX  Take 1 tablet (70 mg total) by mouth every 7 (seven) days. Take with a full glass of water on an empty stomach.   amphetamine -dextroamphetamine  30 MG 24 hr  capsule Commonly known as: Adderall  XR Take 1 capsule by mouth every morning.   amphetamine -dextroamphetamine  30 MG 24 hr capsule Commonly known as: Adderall  XR Take 1 capsule (30 mg total) by mouth every morning.   amphetamine -dextroamphetamine  30 MG 24 hr capsule Commonly known as: Adderall  XR Take 1 capsule (30 mg total) by mouth every morning.   amphetamine -dextroamphetamine  20 MG tablet Commonly known as: Adderall  Take 1.5 tablets (30 mg total) by mouth daily.   amphetamine -dextroamphetamine  30 MG 24 hr capsule Commonly known as: Adderall  XR Take 1 capsule (30 mg total) by mouth in the morning.   amphetamine -dextroamphetamine  20 MG tablet Commonly known as: Adderall  Take 1.5 tablets (30 mg total) by mouth daily.   amphetamine -dextroamphetamine  30 MG 24  hr capsule Commonly known as: Adderall  XR Take 1 capsule (30 mg total) by mouth in the morning.   Amphet-Dextroamphet 3-Bead ER 50 MG Cp24 Commonly known as: Mydayis Take 1 capsule (50 mg total) by mouth in the morning.   Amphet-Dextroamphet 3-Bead ER 50 MG Cp24 Commonly known as: Mydayis Take 1 capsule (50 mg total) by mouth in the morning.   ARIPiprazole  2 MG tablet Commonly known as: ABILIFY  Take 1 tablet (2 mg total) by mouth at bedtime.   benzonatate  100 MG capsule Commonly known as: Tessalon  Perles 1-2 capsules up to twice daily as needed for cough.   calcium carbonate 1250 (500 Ca) MG tablet Commonly known as: OS-CAL - dosed in mg of elemental calcium Take 2 tablets by mouth.   Dyanavel  XR 20 MG Tbcr Generic drug: Amphetamine  ER Take 20 mg by mouth in the morning.   ferrous sulfate 324 MG Tbec Take 324 mg by mouth.   fluticasone  50 MCG/ACT nasal spray Commonly known as: FLONASE  Place 2 sprays into both nostrils daily.   levothyroxine  50 MCG tablet Commonly known as: SYNTHROID  Take 1 tablet (50 mcg total) by mouth daily.   Melatonin 10 MG Tabs Take by mouth.   multivitamin capsule Take 1 capsule by mouth daily.   Spravato  (84 MG Dose) 28 MG/DEVICE Sopk Generic drug: Esketamine HCl (84 MG Dose) THREE SPRAYS IN EACH NOSTRIL TWICE A WEEK   Spravato  (84 MG Dose) 28 MG/DEVICE Sopk Generic drug: Esketamine HCl (84 MG Dose) THREE SPRAYS IN EACH NOSTRIL ONCE A WEEK   traZODone  50 MG tablet Commonly known as: DESYREL  Take 2-3 tablets (100-150 mg total) by mouth at bedtime.   triamcinolone  cream 0.1 % Commonly known as: KENALOG  Apply 1 Application topically 2 (two) times daily.   Trintellix  20 MG Tabs tablet Generic drug: vortioxetine  HBr Take 1 tablet (20 mg total) by mouth daily.   VITAMIN D3 PO Take by mouth.           OBJECTIVE:  VS: BP 114/72 (BP Location: Left Arm, Patient Position: Sitting, Cuff Size: Large)   Pulse 78   Ht 5' 4.5  (1.638 m)   Wt 170 lb (77.1 kg)   LMP 03/31/2011   SpO2 99%   BMI 28.73 kg/m    Wt Readings from Last 3 Encounters:  09/09/24 170 lb (77.1 kg)  03/04/24 174 lb 9.6 oz (79.2 kg)  09/10/23 183 lb (83 kg)     EXAM: General: Pt appears well and is in NAD  Neck: General: Supple without adenopathy. Thyroid :  No goiter or nodules appreciated.   Lungs: Clear with good BS bilat   Heart: Auscultation: RRR.  Abdomen: soft, nontender  Extremities:  BL LE: No pretibial edema normal ROM and strength.  Mental Status: Judgment, insight: Intact Orientation: Oriented to time, place, and person Mood and affect: No depression, anxiety, or agitation     DATA REVIEWED:      DXA 12/17/2021  Results:   Lumbar spine L1-L4 Femoral neck (FN)  T-score -3.6 RFN: -1.9 LFN: -2.0     ASSESSMENT/PLAN/RECOMMENDATIONS:   Osteoporosis :   -We emphasized the importance of calcium and vitamin D  intake -We also encouraged weightbearing exercises - Her fracture is not considered a fragility as this was a skiing incident  -She is tolerating alendronate  without any side effects - DXA order has been placed and will be scheduled at [facility] - Labs***   Medications : Continue alendronate   70 mg weekly  Calcium 1200 mg daily   Vitamin D  2000 iu daily    2. Hypercalciuria:  -Idiopathic -I had attempted to decrease her vitamin D  from 5000 units to 3000, she is not sure if she is still taking the 5000 or if she is currently on the 3000 units daily, her vitamin D  has increased over the past year, and I suspect she is probably still on the 5000 units daily, I will reduce vitamin D  as below -Will repeat 24-hour urinary calcium excretion -If this remains >250 I will consider reducing [place] calcium as well as vitamin D  depending on her levels  Medication  vitamin D  2000 international units daily  F/U in 1     Signed electronically by: Stefano Redgie Butts, MD  [facility] Endocrinology  [facility] [address] [place], [place] [address] Phone: [phone] FAX: [phone]   CC: Katrinka Garnette KIDD, MD [address] [place] [address] Phone: [phone] Fax: [phone]   Return to Endocrinology clinic as below: Future Appointments  Date Time Provider Department Center  10/19/2024  8:00 AM Katrinka Garnette KIDD, MD [facility]

## 2024-09-10 ENCOUNTER — Other Ambulatory Visit: Payer: Self-pay

## 2024-09-10 ENCOUNTER — Ambulatory Visit: Payer: Self-pay | Admitting: Internal Medicine

## 2024-09-10 LAB — BASIC METABOLIC PANEL WITH GFR
BUN: 18 mg/dL (ref 7–25)
CO2: 30 mmol/L (ref 20–32)
Calcium: 9.5 mg/dL (ref 8.6–10.4)
Chloride: 104 mmol/L (ref 98–110)
Creat: 0.63 mg/dL (ref 0.50–1.05)
Glucose, Bld: 92 mg/dL (ref 65–99)
Potassium: 4.1 mmol/L (ref 3.5–5.3)
Sodium: 141 mmol/L (ref 135–146)
eGFR: 100 mL/min/1.73m2 (ref >=60)

## 2024-09-10 LAB — VITAMIN D 25 HYDROXY (VIT D DEFICIENCY, FRACTURES): Vit D, 25-Hydroxy: 64 ng/mL (ref 30–100)

## 2024-09-10 LAB — ALBUMIN: Albumin: 4.4 g/dL (ref 3.6–5.1)

## 2024-09-10 LAB — PARATHYROID HORMONE, INTACT (NO CA): PTH: 22 pg/mL (ref 16–77)

## 2024-09-10 NOTE — Progress Notes (Signed)
 Specialty Pharmacy Refill Coordination Note  Eesha Schmaltz is a 63 y.o. female contacted today regarding refills of specialty medication(s) Esketamine HCl (Spravato  (84 MG Dose))   Patient requested Courier to Provider Office   Delivery date: 09/13/24   Verified address: [facility] office- [address], [address], [place], [place], ATTN: Lauraine   Medication will be filled on 09/13/24.     Same day courier, signature required. REMS Authorization Code to be added on date dispensed.

## 2024-09-13 ENCOUNTER — Other Ambulatory Visit: Payer: Self-pay

## 2024-09-13 NOTE — Progress Notes (Signed)
 09/13/24 REMS Authorization Code: [id number]; dispensed same day courier, signature required.

## 2024-09-16 ENCOUNTER — Ambulatory Visit (INDEPENDENT_AMBULATORY_CARE_PROVIDER_SITE_OTHER)
Admission: RE | Admit: 2024-09-16 | Discharge: 2024-09-16 | Disposition: A | Discharge status: Home / Self Care | Source: Ambulatory Visit | Attending: Internal Medicine | Admitting: Internal Medicine

## 2024-09-16 DIAGNOSIS — M810 Age-related osteoporosis without current pathological fracture: Secondary | ICD-10-CM

## 2024-09-17 ENCOUNTER — Other Ambulatory Visit: Payer: Self-pay

## 2024-09-23 ENCOUNTER — Other Ambulatory Visit (HOSPITAL_BASED_OUTPATIENT_CLINIC_OR_DEPARTMENT_OTHER): Payer: Self-pay

## 2024-09-23 DIAGNOSIS — F332 Major depressive disorder, recurrent severe without psychotic features: Secondary | ICD-10-CM | POA: Diagnosis not present

## 2024-09-23 MED ORDER — AMPHET-DEXTROAMPHET 3-BEAD ER 50 MG PO CP24
50.0000 mg | ORAL_CAPSULE | Freq: Every morning | ORAL | 0 refills | Status: AC
  Filled 2024-09-23: qty 30, 30d supply, fill #0

## 2024-09-24 ENCOUNTER — Other Ambulatory Visit (HOSPITAL_COMMUNITY): Payer: Self-pay

## 2024-09-24 ENCOUNTER — Other Ambulatory Visit: Payer: Self-pay

## 2024-09-24 MED ORDER — SPRAVATO (84 MG DOSE) 28 MG/DEVICE NA SOPK
PACK | NASAL | 0 refills | Status: DC
  Filled 2024-09-24: qty 3, 7d supply, fill #0

## 2024-09-24 NOTE — Progress Notes (Signed)
 Same-Day courier (signature required) sending 09/24/24   Control [id number]

## 2024-09-24 NOTE — Progress Notes (Signed)
 Specialty Pharmacy Refill Coordination Note  Lindsey Davis is a 63 y.o. female assessed today regarding refills of clinic administered specialty medication(s) Esketamine HCl (Spravato  (84 MG Dose))   Clinic requested Courier to Provider Office   Delivery date: 09/24/24   Verified address: [facility] office- [address], [address], [place], [place], ATTN: Lauraine   Medication will be filled on 09/24/24.   Per Camie appt 10/13, no doses on hand.

## 2024-09-25 DIAGNOSIS — H524 Presbyopia: Secondary | ICD-10-CM | POA: Diagnosis not present

## 2024-09-30 ENCOUNTER — Telehealth: Admitting: Physician Assistant

## 2024-09-30 DIAGNOSIS — H9202 Otalgia, left ear: Secondary | ICD-10-CM

## 2024-09-30 DIAGNOSIS — R6884 Jaw pain: Secondary | ICD-10-CM

## 2024-09-30 NOTE — Progress Notes (Signed)
   I feel your condition warrants further evaluation and I recommend that you be seen in a face-to-face visit.   NOTE: There will be NO CHARGE for this E-Visit   If you are having a true medical emergency, please call 911.     For an urgent face to face visit, [facility] has multiple urgent care centers for your convenience.  Click the link below for the full list of locations and hours, walk-in wait times, appointment scheduling options and driving directions:  Urgent Care - [place], [place], [place], [place], [place], [place]  [place]     Your MyChart E-visit questionnaire answers were reviewed by a board certified advanced clinical practitioner to complete your personal care plan based on your specific symptoms.    Thank you for using e-Visits.

## 2024-10-01 ENCOUNTER — Ambulatory Visit: Admitting: Family Medicine

## 2024-10-01 ENCOUNTER — Other Ambulatory Visit: Payer: Self-pay

## 2024-10-01 ENCOUNTER — Other Ambulatory Visit (HOSPITAL_BASED_OUTPATIENT_CLINIC_OR_DEPARTMENT_OTHER): Payer: Self-pay

## 2024-10-01 VITALS — BP 130/80 | HR 80 | Temp 98.0°F | Ht 64.5 in | Wt 171.4 lb

## 2024-10-01 DIAGNOSIS — H6692 Otitis media, unspecified, left ear: Secondary | ICD-10-CM | POA: Diagnosis not present

## 2024-10-01 DIAGNOSIS — H6592 Unspecified nonsuppurative otitis media, left ear: Secondary | ICD-10-CM | POA: Diagnosis not present

## 2024-10-01 DIAGNOSIS — M810 Age-related osteoporosis without current pathological fracture: Secondary | ICD-10-CM

## 2024-10-01 MED ORDER — FLUTICASONE PROPIONATE 50 MCG/ACT NA SUSP
2.0000 | Freq: Every day | NASAL | 3 refills | Status: AC
  Filled 2024-10-01: qty 48, 90d supply, fill #0
  Filled 2024-12-26: qty 48, 90d supply, fill #1

## 2024-10-01 MED FILL — Amoxicillin & K Clavulanate Tab 875-125 MG: 1.0000 | ORAL | 7 days supply | Qty: 14 | Fill #0 | Status: AC

## 2024-10-01 NOTE — Patient Instructions (Addendum)
 3 to 4 days of left ear and jaw pain- on exam there is mild increased erythema on the left ear compared to the right. Also ear appears rather full possible fluid behind membrane- we discussed possible early infection and does appear to have some fluid- we will treat fluid by having her restart the Flonase  and add something like allegra or claritin for 2-4 weeks and to be cautious on early infection we will treat with Augmentin - she has some sinus pressure as well and that would cover that if bacterial element. Return to care if worsens or fails to improve  Recommended follow up: Return for as needed for new, worsening, persistent symptoms.

## 2024-10-01 NOTE — Progress Notes (Signed)
 Phone [phone] In person visit   Subjective:   Lindsey Davis is a 63 y.o. year old very pleasant female patient who presents for/with See problem oriented charting Chief Complaint  Patient presents with   Ear Pain    Started Tuesday of this week; wonders if its medication related;    Jaw Pain   Past Medical History-  Patient Active Problem List   Diagnosis Date Noted   Age-related osteoporosis without current pathological fracture 03/04/2022    Priority: Medium    Hypothyroidism 01/17/2016    Priority: Medium    Obesity 10/21/2014    Priority: Medium    Depression 03/28/2008    Priority: Medium    Attention deficit disorder 03/28/2008    Priority: Medium    Asthma 02/09/2008    Priority: Medium    Allergic rhinitis 02/09/2008    Priority: Low    Medications- reviewed and updated Current Outpatient Medications  Medication Sig Dispense Refill   acetaminophen (TYLENOL) 500 MG tablet Take 500 mg by mouth every 6 (six) hours as needed.     albuterol  (VENTOLIN  HFA) 108 (90 Base) MCG/ACT inhaler Inhale into the lungs as directed. 6.7 g 5   alendronate  (FOSAMAX ) 70 MG tablet Take 1 tablet (70 mg total) by mouth every 7 (seven) days. Take with a full glass of water on an empty stomach. 13 tablet 3   amoxicillin -clavulanate (AUGMENTIN ) 875-125 MG tablet Take 1 tablet by mouth 2 (two) times daily for 7 days. 14 tablet 0   Amphet-Dextroamphet 3-Bead ER (MYDAYIS) 50 MG CP24 Take 1 capsule (50 mg total) by mouth every morning. 30 capsule 0   calcium carbonate (OS-CAL - DOSED IN MG OF ELEMENTAL CALCIUM) 1250 (500 Ca) MG tablet Take 2 tablets by mouth.     Cholecalciferol (VITAMIN D3 PO) Take by mouth.     Esketamine HCl, 84 MG Dose, (SPRAVATO , 84 MG DOSE,) 28 MG/DEVICE SOPK THREE SPRAYS IN EACH NOSTRIL ONCE A WEEK 3 each 0   levothyroxine  (SYNTHROID ) 50 MCG tablet Take 1 tablet (50 mcg total) by mouth daily. 90 tablet 3   Multiple Vitamin (MULTIVITAMIN) capsule Take 1 capsule by  mouth daily.     traZODone  (DESYREL ) 50 MG tablet Take 2-3 tablets (100-150 mg total) by mouth at bedtime. 270 tablet 3   vortioxetine  HBr (TRINTELLIX ) 20 MG TABS tablet Take 1 tablet (20 mg total) by mouth daily. 90 tablet 3   amphetamine -dextroamphetamine  (ADDERALL  XR) 30 MG 24 hr capsule Take 1 capsule (30 mg total) by mouth every morning. (Patient not taking: Reported on 10/01/2024) 30 capsule 0   fluticasone  (FLONASE ) 50 MCG/ACT nasal spray Place 2 sprays into both nostrils daily. 48 g 3   No current facility-administered medications for this visit.     Objective:  BP 130/80 (BP Location: Left Arm, Patient Position: Sitting, Cuff Size: Normal)   Pulse 80   Temp 98 F (36.7 C) (Temporal)   Ht 5' 4.5 (1.638 m)   Wt 171 lb 6.4 oz (77.7 kg)   LMP 03/31/2011   SpO2 96%   BMI 28.97 kg/m  Gen: NAD, resting comfortably Right tympanic membrane normal, left tympanic membrane with mild increased erythema and some bulging-slightly cloudy fluid behind membrane.  Appears to have some scarring with some whitish discoloration as well Left maxillary sinus mildly tender Nasal turbinates largely normal Oropharynx largely normal-no obvious exposed teeth CV: RRR no murmurs rubs or gallops Lungs: CTAB no crackles, wheeze, rhonchi Abdomen: soft/nontender/nondistended/normal bowel sounds. No rebound  or guarding.  Ext: no edema Skin: warm, dry     Assessment and Plan   # Left ear and jaw pain S:from scheduling notes I started having left ear pain, in my jaw at times Tuesday or Wednesday this week. It would start in the evening, relieved with ibuprofen. Today it started  around lunch time, some relief with 4 ibuprofen. Very uncomfortable. Did an e  visit this afternoon. I wonder if it could be related to fosamax .    She started on Fosamax  Mid 2023.  Follows with endocrinology  She has not had any significant congestion lately-some mild bowing nose intermittently- has not used Flonase  lately.  . Today pain 2-4/10 with no discharge. No hearing loss. No fatigue, fever, chills, body aches. Slight headache on left side frontal A/P: 3 to 4 days of left ear and jaw pain- on exam there is mild increased erythema on the left ear compared to the right. Also ear appears rather full possible fluid behind membrane- we discussed possible early infection and does appear to have some fluid- we will treat fluid by having her restart the Flonase  and add something like allegra or claritin for 2-4 weeks and to be cautious on early infection we will treat with Augmentin - she has some sinus pressure as well and that would cover that if bacterial element. Return to care if worsens or fails to improve - We also gave option of treating only for otitis media with effusion upfront with only mild increase in erythema but she would prefer to be more aggressive-does not tolerate pain well - In regards to her question about Fosamax -see below   # Osteoporosis-follows with Dr. Sam S: Last DEXA: 12/17/21 with lumbar spine -3.6 with improvement on bone density on 09-16-24  Medication (bisphosphonate or prolia):  fosamax  recommended 12/18/21 - started mid 2023  Calcium: 1200mg  (through diet ok)  Vitamin D : 1000 units a day   A/P: Bone density improving on Fosamax .  Dental exam encouraging and pain seems to really localize to the ears I do not think this is osteonecrosis of the jaw or related.  Obviously we could refer her to dentist for updated evaluation if symptoms fail to improve with above treatments.  Hold off on dental CT-we do not have panoramic available outpatient  Recommended follow up: Return for as needed for new, worsening, persistent symptoms. Future Appointments  Date Time Provider Department Center  10/19/2024  8:00 AM Katrinka Garnette KIDD, MD [facility]    Lab/Order associations:   ICD-10-CM   1. Left otitis media, unspecified otitis media type  H66.92     2. Left otitis media with effusion   H65.92 fluticasone  (FLONASE ) 50 MCG/ACT nasal spray    3. Age-related osteoporosis without current pathological fracture  M81.0       Meds ordered this encounter  Medications   amoxicillin -clavulanate (AUGMENTIN ) 875-125 MG tablet    Sig: Take 1 tablet by mouth 2 (two) times daily for 7 days.    Dispense:  14 tablet    Refill:  0   fluticasone  (FLONASE ) 50 MCG/ACT nasal spray    Sig: Place 2 sprays into both nostrils daily.    Dispense:  48 g    Refill:  3    Return precautions advised.  Garnette Katrinka, MD

## 2024-10-06 DIAGNOSIS — F332 Major depressive disorder, recurrent severe without psychotic features: Secondary | ICD-10-CM | POA: Diagnosis not present

## 2024-10-08 ENCOUNTER — Other Ambulatory Visit: Payer: Self-pay

## 2024-10-19 ENCOUNTER — Encounter: Payer: Self-pay | Admitting: Family Medicine

## 2024-10-19 ENCOUNTER — Ambulatory Visit: Payer: Self-pay | Admitting: Family Medicine

## 2024-10-19 ENCOUNTER — Ambulatory Visit (INDEPENDENT_AMBULATORY_CARE_PROVIDER_SITE_OTHER): Admitting: Family Medicine

## 2024-10-19 VITALS — BP 110/68 | HR 77 | Temp 98.2°F | Ht 64.5 in | Wt 172.4 lb

## 2024-10-19 DIAGNOSIS — M810 Age-related osteoporosis without current pathological fracture: Secondary | ICD-10-CM

## 2024-10-19 DIAGNOSIS — Z13220 Encounter for screening for lipoid disorders: Secondary | ICD-10-CM | POA: Diagnosis not present

## 2024-10-19 DIAGNOSIS — Z0000 Encounter for general adult medical examination without abnormal findings: Secondary | ICD-10-CM

## 2024-10-19 DIAGNOSIS — Z130 Encounter for screening for diseases of the blood and blood-forming organs and certain disorders involving the immune mechanism: Secondary | ICD-10-CM | POA: Diagnosis not present

## 2024-10-19 DIAGNOSIS — Z87891 Personal history of nicotine dependence: Secondary | ICD-10-CM | POA: Diagnosis not present

## 2024-10-19 DIAGNOSIS — Z131 Encounter for screening for diabetes mellitus: Secondary | ICD-10-CM | POA: Diagnosis not present

## 2024-10-19 DIAGNOSIS — E663 Overweight: Secondary | ICD-10-CM | POA: Diagnosis not present

## 2024-10-19 DIAGNOSIS — E039 Hypothyroidism, unspecified: Secondary | ICD-10-CM | POA: Diagnosis not present

## 2024-10-19 LAB — URINALYSIS, ROUTINE W REFLEX MICROSCOPIC
Bilirubin Urine: NEGATIVE
Hgb urine dipstick: NEGATIVE
Ketones, ur: NEGATIVE
Leukocytes,Ua: NEGATIVE
Nitrite: NEGATIVE
RBC / HPF: NONE SEEN (ref 0–?)
Specific Gravity, Urine: 1.015 (ref 1.000–1.030)
Total Protein, Urine: NEGATIVE
Urine Glucose: NEGATIVE
Urobilinogen, UA: 0.2 (ref 0.0–1.0)
pH: 8.5 — AB (ref 5.0–8.0)

## 2024-10-19 LAB — CBC WITH DIFFERENTIAL/PLATELET
Basophils Absolute: 0 K/uL (ref 0.0–0.1)
Basophils Relative: 0.9 % (ref 0.0–3.0)
Eosinophils Absolute: 0 K/uL (ref 0.0–0.7)
Eosinophils Relative: 0.7 % (ref 0.0–5.0)
HCT: 40.2 % (ref 36.0–46.0)
Hemoglobin: 13.2 g/dL (ref 12.0–15.0)
Lymphocytes Relative: 28.4 % (ref 12.0–46.0)
Lymphs Abs: 0.9 K/uL (ref 0.7–4.0)
MCHC: 32.8 g/dL (ref 30.0–36.0)
MCV: 89.2 fl (ref 78.0–100.0)
Monocytes Absolute: 0.2 K/uL (ref 0.1–1.0)
Monocytes Relative: 6.9 % (ref 3.0–12.0)
Neutro Abs: 1.9 K/uL (ref 1.4–7.7)
Neutrophils Relative %: 63.1 % (ref 43.0–77.0)
Platelets: 203 K/uL (ref 150.0–400.0)
RBC: 4.51 Mil/uL (ref 3.87–5.11)
RDW: 14.2 % (ref 11.5–15.5)
WBC: 3 K/uL — ABNORMAL LOW (ref 4.0–10.5)

## 2024-10-19 LAB — COMPREHENSIVE METABOLIC PANEL WITH GFR
ALT: 19 U/L (ref 0–35)
AST: 21 U/L (ref 0–37)
Albumin: 4.4 g/dL (ref 3.5–5.2)
Alkaline Phosphatase: 34 U/L — ABNORMAL LOW (ref 39–117)
BUN: 16 mg/dL (ref 6–23)
CO2: 29 meq/L (ref 19–32)
Calcium: 9.3 mg/dL (ref 8.4–10.5)
Chloride: 103 meq/L (ref 96–112)
Creatinine, Ser: 0.63 mg/dL (ref 0.40–1.20)
GFR: 93.98 mL/min (ref >60.00)
Glucose, Bld: 95 mg/dL (ref 70–99)
Potassium: 4.9 meq/L (ref 3.5–5.1)
Sodium: 139 meq/L (ref 135–145)
Total Bilirubin: 0.4 mg/dL (ref 0.2–1.2)
Total Protein: 6.6 g/dL (ref 6.0–8.3)

## 2024-10-19 LAB — LIPID PANEL
Cholesterol: 223 mg/dL — ABNORMAL HIGH (ref 0–200)
HDL: 73.2 mg/dL (ref >39.00)
LDL Cholesterol: 136 mg/dL — ABNORMAL HIGH (ref 0–99)
NonHDL: 149.81
Total CHOL/HDL Ratio: 3
Triglycerides: 70 mg/dL (ref 0.0–149.0)
VLDL: 14 mg/dL (ref 0.0–40.0)

## 2024-10-19 LAB — HEMOGLOBIN A1C: Hgb A1c MFr Bld: 6 % (ref 4.6–6.5)

## 2024-10-19 NOTE — Progress Notes (Signed)
 Phone [phone]   Subjective:  Patient presents today for their annual physical. Chief complaint-noted.   See problem oriented charting- ROS- full  review of systems was completed and negative except for topics noted under acute/chronic concerns   The following were reviewed and entered/updated in epic: Past Medical History:  Diagnosis Date   Allergy    Anemia    Asthma    Fibrocystic breast    Iron deficiency anemia 09/20/2010   Resolved post menopause.     Patient Active Problem List   Diagnosis Date Noted   Age-related osteoporosis without current pathological fracture 03/04/2022    Priority: Medium    Hypothyroidism 01/17/2016    Priority: Medium    Obesity 10/21/2014    Priority: Medium    Depression 03/28/2008    Priority: Medium    Attention deficit disorder 03/28/2008    Priority: Medium    Asthma 02/09/2008    Priority: Medium    Allergic rhinitis 02/09/2008    Priority: Low   Past Surgical History:  Procedure Laterality Date   COLONOSCOPY     WRIST SURGERY      Family History  Problem Relation Age of Onset   Breast cancer Mother        109   Urinary tract infection Mother        chronic   Heart attack Father        35   Breast cancer Sister        early 37s   Cancer Paternal Grandmother    Colon cancer Neg Hx    Colon polyps Neg Hx    Esophageal cancer Neg Hx    Rectal cancer Neg Hx    Stomach cancer Neg Hx     Medications- reviewed and updated Current Outpatient Medications  Medication Sig Dispense Refill   acetaminophen (TYLENOL) 500 MG tablet Take 500 mg by mouth every 6 (six) hours as needed.     albuterol  (VENTOLIN  HFA) 108 (90 Base) MCG/ACT inhaler Inhale into the lungs as directed. 6.7 g 5   alendronate  (FOSAMAX ) 70 MG tablet Take 1 tablet (70 mg total) by mouth every 7 (seven) days. Take with a full glass of water on an empty stomach. 13 tablet 3   Amphet-Dextroamphet 3-Bead ER (MYDAYIS) 50 MG CP24 Take 1 capsule (50 mg total) by  mouth every morning. 30 capsule 0   calcium carbonate (OS-CAL - DOSED IN MG OF ELEMENTAL CALCIUM) 1250 (500 Ca) MG tablet Take 2 tablets by mouth.     Cholecalciferol (VITAMIN D3 PO) Take by mouth.     Esketamine HCl, 84 MG Dose, (SPRAVATO , 84 MG DOSE,) 28 MG/DEVICE SOPK THREE SPRAYS IN EACH NOSTRIL ONCE A WEEK 3 each 0   fluticasone  (FLONASE ) 50 MCG/ACT nasal spray Place 2 sprays into both nostrils daily. 48 g 3   levothyroxine  (SYNTHROID ) 50 MCG tablet Take 1 tablet (50 mcg total) by mouth daily. 90 tablet 3   Multiple Vitamin (MULTIVITAMIN) capsule Take 1 capsule by mouth daily.     traZODone  (DESYREL ) 50 MG tablet Take 2-3 tablets (100-150 mg total) by mouth at bedtime. 270 tablet 3   vortioxetine  HBr (TRINTELLIX ) 20 MG TABS tablet Take 1 tablet (20 mg total) by mouth daily. 90 tablet 3   No current facility-administered medications for this visit.    Allergies-reviewed and updated No Known Allergies  Social History   Social History Narrative   Married 1990. 3 kids (1 set of twins 54 in 2017, oldest  23- started nursing school).       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      Hobbies: enjoys exercise   Objective  Objective:  BP 110/68 (BP Location: Left Arm, Patient Position: Sitting, Cuff Size: Normal)   Pulse 77   Temp 98.2 F (36.8 C) (Temporal)   Ht 5' 4.5 (1.638 m)   Wt 172 lb 6.4 oz (78.2 kg)   LMP 03/31/2011   SpO2 97%   BMI 29.14 kg/m  Gen: NAD, resting comfortably HEENT: Mucous membranes are moist. Oropharynx normal Neck: no thyromegaly CV: RRR no murmurs rubs or gallops Lungs: CTAB no crackles, wheeze, rhonchi Abdomen: soft/nontender/nondistended/normal bowel sounds. No rebound or guarding.  Ext: no edema Skin: warm, dry Neuro: grossly normal, moves all extremities, PERRLA   Assessment and Plan    63 y.o. female presenting for annual physical.  Health Maintenance counseling: 1. Anticipatory guidance: Patient counseled regarding regular dental exams -q6 months, eye exams - yearly,  avoiding smoking and second hand smoke , limiting alcohol to 1 beverage per day- 2-3 per week , no illicit drugs .   2. Risk factor reduction:  Advised patient of need for regular exercise and diet rich and fruits and vegetables to reduce risk of heart attack and stroke.  Exercise-was doing treadmill last year pretty regularly- treadmill broke- had started running again though- tough with shortened days.  Diet/weight management-Down 9 pounds from last physical-ongoing fantastic efforts. Wt Readings from Last 3 Encounters:  10/19/24 172 lb 6.4 oz (78.2 kg)  10/01/24 171 lb 6.4 oz (77.7 kg)  09/09/24 170 lb (77.1 kg)  3. Immunizations/screenings/ancillary studies-holding off on Prevnar 20 and COVID but otherwise up-to-date Immunization History  Administered Date(s) Administered   Fluzone Influenza virus vaccine,trivalent (IIV3), split virus 09/01/2024   Influenza Whole 09/10/2008   Influenza,inj,Quad PF,6+ Mos 10/20/2017, 11/26/2018, 08/17/2019, 09/29/2021   Influenza-Unspecified 08/16/2014, 11/01/2015, 10/16/2020, 10/31/2023   PFIZER(Purple Top)SARS-COV-2 Vaccination 12/27/2019, 01/17/2020, 11/08/2020, 06/30/2021   Tdap 03/25/2013, 04/03/2023   Zoster Recombinant(Shingrix ) 08/17/2019, 11/17/2019   4. Cervical cancer screening- reassuring Pap 04/03/2023-no repeat required 5. Breast cancer screening-  breast exam -self exams- and mammogram 05/07/2024 and require follow-up diagnostic mammogram which was reassuring 6. Colon cancer screening - colonoscopy 02/22/21 with 10 year repeat planned 7. Skin cancer screening- no dermatologist. advised regular sunscreen use. Denies worrisome, changing, or new skin lesions.  8. Birth control/STD check- postmenopausal monogamous 9. Osteoporosis screening at 64-follows with  endocrinology see below 10. Smoking associated screening -former smoker quit smoking in the 2000's-get urinalysis  Status of chronic or acute concerns   # Left otitis media-treated with Augmentin  about 2 weeks ago - doing much better  #hypothyroidism S: compliant On thyroid  medication-levothyroxine  50 mcg  Lab Results  Component Value Date   TSH 2.35 09/10/2023  A/P:hopefully stable- update tsh today. Continue current meds for now     # Asthma- has albuterol  but doesn't need - has been years  # Adult ADD-through Dr. Vincente sickles very helpful   # Depression S: Medication:Abilify  2 mg--> spravato  spray, Trintellix  20 mg, trazodone  50 mg for sleep     10/19/2024    8:06 AM 10/01/2024    8:03 AM 03/04/2024   12:55 PM  Depression screen PHQ 2/9  Decreased Interest 0 0 0  Down, Depressed, Hopeless 0 0 0  PHQ -  2 Score 0 0 0  Altered sleeping 0 0 0  Tired, decreased energy 0 0 0  Change in appetite 0 0 0  Feeling bad or failure about yourself  0 0 0  Trouble concentrating 0 0 0  Moving slowly or fidgety/restless 0 0 0  Suicidal thoughts 0 0 0  PHQ-9 Score 0 0 0  Difficult doing work/chores Not difficult at all Not difficult at all Not difficult at all   A/P: full remission- continue current medications and psychiatry follow up    # Osteoporosis-follows with Dr. Sam S: Last DEXA: 12/17/21 with lumbar spine -3.6 with improvement on bone density on 09-16-24- running helpful  Medication (bisphosphonate or prolia):  fosamax  recommended 12/18/21 - started mid 2023  Calcium: 1200mg  (through diet ok) recommended  Vitamin D : 1000 units a day recommended  A/P: stable- continue current medicines     Recommended follow up: Return in about 1 year (around 10/19/2025) for physical or sooner if needed.Schedule b4 you leave.  Lab/Order associations: fasting   ICD-10-CM   1. Preventative health care  Z00.00     2. Hypothyroidism, unspecified type  E03.9     3. Age-related  osteoporosis without current pathological fracture  M81.0     4. Screening for hyperlipidemia  Z13.220     5. Screening for diabetes mellitus  Z13.1     6. Screening for deficiency anemia  Z13.0       No orders of the defined types were placed in this encounter.   Return precautions advised.  Garnette Lukes, MD

## 2024-10-19 NOTE — Patient Instructions (Addendum)
 Please stop by lab before you go If you have mychart- we will send your results within 3 business days of us  receiving them.  If you do not have mychart- we will call you about results within 5 business days of us  receiving them.  *please also note that you will see labs on mychart as soon as they post. I will later go in and write notes on them- will say notes from Dr. Katrinka   No changes today unless labs lead us  to make changes   Recommended follow up: Return in about 1 year (around 10/19/2025) for physical or sooner if needed.Schedule b4 you leave.

## 2024-10-20 LAB — TSH: TSH: 2.37 u[IU]/mL (ref 0.35–5.50)

## 2024-10-21 ENCOUNTER — Other Ambulatory Visit (HOSPITAL_BASED_OUTPATIENT_CLINIC_OR_DEPARTMENT_OTHER): Payer: Self-pay

## 2024-10-21 MED ORDER — AMPHET-DEXTROAMPHET 3-BEAD ER 50 MG PO CP24
50.0000 mg | ORAL_CAPSULE | Freq: Every morning | ORAL | 0 refills | Status: AC
Start: 2024-10-23 — End: ?
  Filled 2024-10-23: qty 30, 30d supply, fill #0

## 2024-10-22 ENCOUNTER — Other Ambulatory Visit: Payer: Self-pay

## 2024-10-22 ENCOUNTER — Other Ambulatory Visit (HOSPITAL_COMMUNITY): Payer: Self-pay

## 2024-10-22 MED ORDER — SPRAVATO (84 MG DOSE) 28 MG/DEVICE NA SOPK
PACK | NASAL | 0 refills | Status: DC
  Filled 2024-10-22: qty 3, 7d supply, fill #0

## 2024-10-22 NOTE — Progress Notes (Signed)
 Specialty Pharmacy Refill Coordination Note  Ammie Warrick is a 63 y.o. female assessed today regarding refills of clinic administered specialty medication(s) Esketamine HCl (Spravato  (84 MG Dose))   Clinic requested Courier to Provider Office   Delivery date: 10/25/24   Verified address: Dr. Jonothan office- [address], [address], [place], [place], ATTN: Lauraine   Medication will be filled on: 10/25/24  Appt 11/15

## 2024-10-23 ENCOUNTER — Other Ambulatory Visit (HOSPITAL_COMMUNITY): Payer: Self-pay

## 2024-10-23 ENCOUNTER — Other Ambulatory Visit (HOSPITAL_BASED_OUTPATIENT_CLINIC_OR_DEPARTMENT_OTHER): Payer: Self-pay

## 2024-10-25 ENCOUNTER — Other Ambulatory Visit: Payer: Self-pay

## 2024-10-25 NOTE — Progress Notes (Signed)
 Dispensed 10/25/24, same day courier, signature required. REMS Authorization Code [id number].

## 2024-10-29 ENCOUNTER — Other Ambulatory Visit: Payer: Self-pay

## 2024-10-30 ENCOUNTER — Other Ambulatory Visit (HOSPITAL_COMMUNITY): Payer: Self-pay

## 2024-10-30 DIAGNOSIS — F332 Major depressive disorder, recurrent severe without psychotic features: Secondary | ICD-10-CM | POA: Diagnosis not present

## 2024-10-30 MED ORDER — SPRAVATO (84 MG DOSE) 28 MG/DEVICE NA SOPK
2.0000 | PACK | NASAL | 0 refills | Status: DC
  Filled 2024-10-30: qty 6, 14d supply, fill #0

## 2024-11-01 ENCOUNTER — Other Ambulatory Visit: Payer: Self-pay

## 2024-11-01 ENCOUNTER — Other Ambulatory Visit (HOSPITAL_COMMUNITY): Payer: Self-pay

## 2024-11-01 NOTE — Progress Notes (Signed)
 Specialty Pharmacy Refill Coordination Note  Lindsey Davis is a 63 y.o. female assessed today regarding refills of clinic administered specialty medication(s) Esketamine HCl (Spravato  (84 MG Dose))   Clinic requested Courier to Provider Office   Delivery date: 11/03/24   Verified address: Dr. Jonothan office- [address], [place], [place], ATTN: Lauraine   Medication will be filled on: 11/03/24   Per Sarah, patient is doing well on therapy with no issues or concerns.   Filled for 14 day supply as patient has appointments on 11/21 & 11/24.   REMs authorization information will be added when dispensed.

## 2024-11-03 ENCOUNTER — Other Ambulatory Visit: Payer: Self-pay

## 2024-11-03 NOTE — Progress Notes (Signed)
 11/03/24 REMS Authorization Code: [id number]

## 2024-11-05 ENCOUNTER — Other Ambulatory Visit (HOSPITAL_BASED_OUTPATIENT_CLINIC_OR_DEPARTMENT_OTHER): Payer: Self-pay

## 2024-11-05 ENCOUNTER — Other Ambulatory Visit: Payer: Self-pay | Admitting: Internal Medicine

## 2024-11-05 DIAGNOSIS — F332 Major depressive disorder, recurrent severe without psychotic features: Secondary | ICD-10-CM | POA: Diagnosis not present

## 2024-11-05 MED ORDER — ALENDRONATE SODIUM 70 MG PO TABS
70.0000 mg | ORAL_TABLET | ORAL | 0 refills | Status: AC
  Filled 2024-11-05: qty 12, 84d supply, fill #0

## 2024-11-09 ENCOUNTER — Ambulatory Visit: Admitting: Family Medicine

## 2024-11-09 ENCOUNTER — Other Ambulatory Visit (HOSPITAL_BASED_OUTPATIENT_CLINIC_OR_DEPARTMENT_OTHER): Payer: Self-pay

## 2024-11-09 ENCOUNTER — Encounter: Payer: Self-pay | Admitting: Family Medicine

## 2024-11-09 ENCOUNTER — Ambulatory Visit: Payer: Self-pay

## 2024-11-09 VITALS — BP 120/78 | HR 77 | Temp 97.9°F | Ht 64.5 in | Wt 176.3 lb

## 2024-11-09 DIAGNOSIS — S335XXA Sprain of ligaments of lumbar spine, initial encounter: Secondary | ICD-10-CM | POA: Diagnosis not present

## 2024-11-09 MED ORDER — NAPROXEN 500 MG PO TABS
500.0000 mg | ORAL_TABLET | Freq: Two times a day (BID) | ORAL | 0 refills | Status: AC
  Filled 2024-11-09: qty 30, 15d supply, fill #0

## 2024-11-09 MED ORDER — METHOCARBAMOL 500 MG PO TABS
500.0000 mg | ORAL_TABLET | Freq: Four times a day (QID) | ORAL | 0 refills | Status: AC
  Filled 2024-11-09: qty 30, 8d supply, fill #0

## 2024-11-09 NOTE — Telephone Encounter (Signed)
 FYI Only or Action Required?: FYI only for provider: appointment scheduled on 11/09/24.  Patient was last seen in primary care on 10/19/2024 by Katrinka Garnette KIDD, MD.  Called Nurse Triage reporting Back Pain.  Symptoms began several days ago.  Interventions attempted: OTC medications: ibuprofen.  Symptoms are: unchanged.  Triage Disposition: See PCP When Office is Open (Within 3 Days)  Patient/caregiver understands and will follow disposition?: Yes   Copied from CRM #[id number]. Topic: Clinical - Red Word Triage >> Nov 09, 2024  8:53 AM Suzen RAMAN wrote: Red Word that prompted transfer to Nurse Triage: sharp pain in lower spine after yard work on Sunday; requesting an appt with Dr. Katrinka Reason for Disposition  [1] MODERATE back pain (e.g., interferes with normal activities) AND [2] present > 3 days  Answer Assessment - Initial Assessment Questions No available appts with pcp,scheduled 11/09/24.  Advised call back or ED if symptoms worsen.  1. ONSET: When did the pain begin? (e.g., minutes, hours, days)     Sunday 2. LOCATION: Where does it hurt? (upper, mid or lower back)     Right lower back 3. SEVERITY: How bad is the pain?  (e.g., Scale 1-10; mild, moderate, or severe)     8/10 sharp, movement 4. PATTERN: Is the pain constant? (e.g., yes, no; constant, intermittent)      intermittent 5. RADIATION: Does the pain shoot into your legs or somewhere else?     no 6. CAUSE:  What do you think is causing the back pain?  Bending down for hours,in garden 8. MEDICINES: What have you taken so far for the pain? (e.g., nothing, acetaminophen, NSAIDS)     ibuprofen 9. NEUROLOGIC SYMPTOMS: Do you have any weakness, numbness, or problems with bowel/bladder control?     no 10. OTHER SYMPTOMS: Do you have any other symptoms? (e.g., fever, abdomen pain, burning with urination, blood in urine)       Denies abd pain, fever, n/v  Protocols used: Back Pain-A-AH

## 2024-11-09 NOTE — Telephone Encounter (Signed)
 Patient scheduled to see Heron Sharper 11/09/2024. Dr. Katrinka will review triage notes.

## 2024-11-09 NOTE — Progress Notes (Signed)
 Acute Office Visit  Subjective:     Patient ID: Lindsey Davis, female    DOB: September 13, 1961, 63 y.o.   MRN: [id number]  Chief Complaint  Patient presents with   Back Pain    Patient complains of right-sided, mid-low back pain x2 days, worse when getting up from seated position and walking, states pain occurred after bending a lot while doing yard work in which she was pulling bulbs, tried Ibuprofen with no relief    Back Pain  Discussed the use of AI scribe software for clinical note transcription with the patient, who gave verbal consent to proceed.  History of Present Illness   Lindsey Davis is a 63 year old female with osteoporosis who presents with acute back pain.  She developed acute right lower back pain near the hip on Sunday after several hours of digging in the yard. The pain was worse this morning than yesterday and makes it hard to get up from the couch and stand up straight after sitting in the car. It interferes with routine activities. She has not had similar back pain before and denies falls, trauma, radiation of pain, numbness, tingling, or bowel or bladder dysfunction.  She took 800 mg ibuprofen before bed last night with minimal relief and has not taken any today due to work. She takes Trintellix  and Fosamax  for osteoporosis and has no known drug allergies.        Review of Systems  Musculoskeletal:  Positive for back pain.  All other systems reviewed and are negative.       Objective:    BP 120/78   Pulse 77   Temp 97.9 F (36.6 C) (Oral)   Ht 5' 4.5 (1.638 m)   Wt 176 lb 4.8 oz (80 kg)   LMP 03/31/2011   SpO2 99%   BMI 29.79 kg/m    Physical Exam Vitals reviewed.  Constitutional:      Appearance: Normal appearance. She is normal weight.  Cardiovascular:     Rate and Rhythm: Normal rate and regular rhythm.     Heart sounds: Normal heart sounds.  Pulmonary:     Effort: Pulmonary effort is normal.  Musculoskeletal:        General:  Tenderness (midline tenderness to palpation just over the hips BL) present. Normal range of motion.  Neurological:     Mental Status: She is alert and oriented to person, place, and time. Mental status is at baseline.  Psychiatric:        Mood and Affect: Mood normal.        Behavior: Behavior normal.     No results found for any visits on 11/09/24.      Assessment & Plan:   Problem List Items Addressed This Visit   None Visit Diagnoses       Lumbar back sprain, initial encounter    -  Primary   Relevant Medications   naproxen  (NAPROSYN ) 500 MG tablet   methocarbamol  (ROBAXIN ) 500 MG tablet     Assessment and Plan    Right-sided lumbar back strain Acute right-sided lumbar back strain likely due to muscle strain from prolonged activity (digging). No history of similar episodes. No neurological symptoms such as numbness, tingling, or radicular pain. No signs of organ dysfunction. Differential diagnosis includes muscle spasm and inflammation due to lactic acidosis from chronic muscle tension. No evidence of compression fracture given the absence of trauma and improvement with Fosamax  therapy. - Prescribed naproxen  500 mg twice daily  for at least 5 days to reduce inflammation. - Prescribed methocarbamol  (Robaxin ) as a muscle relaxant, starting at bedtime to minimize sedation, with the option to increase to four times daily if needed. - Advised taking naproxen  with food to prevent gastrointestinal upset. - Educated on the use of ice for the first 48 hours, followed by heat application to improve blood flow. - Recommended gentle stretching exercises once pain improves. - Discussed potential drug interaction between naproxen  and Trintellix , with increased risk of upper GI bleeding. Advised monitoring for signs of bleeding such as dark, tarry stools.  Osteoporosis Managed with Fosamax  therapy. No recent falls or fractures. Bone density has improved with current treatment regimen. -  Continue Fosamax  therapy to maintain bone density and reduce fracture risk.        Meds ordered this encounter  Medications   naproxen  (NAPROSYN ) 500 MG tablet    Sig: Take 1 tablet (500 mg total) by mouth 2 (two) times daily with a meal.    Dispense:  30 tablet    Refill:  0   methocarbamol  (ROBAXIN ) 500 MG tablet    Sig: Take 1 tablet (500 mg total) by mouth 4 (four) times daily.    Dispense:  30 tablet    Refill:  0    No follow-ups on file.  Lindsey CHRISTELLA Sharper, MD

## 2024-11-10 ENCOUNTER — Other Ambulatory Visit (HOSPITAL_COMMUNITY): Payer: Self-pay

## 2024-11-10 ENCOUNTER — Other Ambulatory Visit: Payer: Self-pay

## 2024-11-12 ENCOUNTER — Other Ambulatory Visit: Payer: Self-pay

## 2024-11-15 ENCOUNTER — Other Ambulatory Visit: Payer: Self-pay

## 2024-11-15 MED ORDER — SPRAVATO (84 MG DOSE) 28 MG/DEVICE NA SOPK
PACK | NASAL | 0 refills | Status: DC
  Filled 2024-11-15: qty 3, 7d supply, fill #0

## 2024-11-15 NOTE — Progress Notes (Signed)
 Specialty Pharmacy Refill Coordination Note  Lindsey Davis is a 63 y.o. female contacted today regarding refills of specialty medication(s) Esketamine HCl (Spravato  (84 MG Dose))   Patient requested Courier to Provider Office   Delivery date: 11/17/24   Verified address: Dr. Jonothan office- [address], [place], [place], ATTN: Lauraine   Medication will be filled on: 11/17/24   Same day courier, signature required. REMS Authorization to be added on date dispensed.

## 2024-11-16 ENCOUNTER — Other Ambulatory Visit: Payer: Self-pay

## 2024-11-17 ENCOUNTER — Other Ambulatory Visit: Payer: Self-pay

## 2024-11-17 NOTE — Progress Notes (Signed)
 11/17/24 REMS Authorization Code: [id number]

## 2024-11-19 ENCOUNTER — Other Ambulatory Visit: Payer: Self-pay

## 2024-11-20 ENCOUNTER — Other Ambulatory Visit (HOSPITAL_BASED_OUTPATIENT_CLINIC_OR_DEPARTMENT_OTHER): Payer: Self-pay

## 2024-11-20 ENCOUNTER — Other Ambulatory Visit (HOSPITAL_COMMUNITY): Payer: Self-pay

## 2024-11-20 DIAGNOSIS — F332 Major depressive disorder, recurrent severe without psychotic features: Secondary | ICD-10-CM | POA: Diagnosis not present

## 2024-11-20 MED ORDER — AMPHET-DEXTROAMPHET 3-BEAD ER 50 MG PO CP24
50.0000 mg | ORAL_CAPSULE | Freq: Every morning | ORAL | 0 refills | Status: AC
  Filled 2024-11-22: qty 30, 30d supply, fill #0

## 2024-11-20 MED ORDER — SPRAVATO (84 MG DOSE) 28 MG/DEVICE NA SOPK
3.0000 | PACK | NASAL | 0 refills | Status: DC
  Filled 2024-11-20 – 2024-11-22 (×2): qty 3, 7d supply, fill #0

## 2024-11-21 ENCOUNTER — Other Ambulatory Visit (HOSPITAL_COMMUNITY): Payer: Self-pay

## 2024-11-22 ENCOUNTER — Other Ambulatory Visit: Payer: Self-pay

## 2024-11-22 ENCOUNTER — Other Ambulatory Visit (HOSPITAL_COMMUNITY): Payer: Self-pay

## 2024-11-22 ENCOUNTER — Other Ambulatory Visit (HOSPITAL_BASED_OUTPATIENT_CLINIC_OR_DEPARTMENT_OTHER): Payer: Self-pay

## 2024-11-22 NOTE — Progress Notes (Signed)
 Specialty Pharmacy Refill Coordination Note  Lindsey Davis is a 63 y.o. female assessed today regarding refills of clinic administered specialty medication(s) Esketamine HCl (Spravato  (84 MG Dose))   Clinic requested Courier to Provider Office   Delivery date: 11/24/24   Verified address: Dr. Jonothan office- [address], [place], [place], ATTN: Lauraine   Medication will be filled on: 11/24/24  Appt 12/13

## 2024-11-24 ENCOUNTER — Other Ambulatory Visit: Payer: Self-pay

## 2024-11-24 NOTE — Progress Notes (Signed)
 Same-Day courier (signature required) sending 11/24/24.  Control Number: [id number]

## 2024-11-24 NOTE — Progress Notes (Signed)
 11/24/24 REMS Authorization Code: [id number]

## 2024-11-25 ENCOUNTER — Other Ambulatory Visit: Payer: Self-pay

## 2024-11-25 MED ORDER — SPRAVATO (84 MG DOSE) 28 MG/DEVICE NA SOPK
PACK | NASAL | 0 refills | Status: DC
  Filled 2024-11-25: qty 3, 7d supply, fill #0

## 2024-11-25 NOTE — Progress Notes (Signed)
 Specialty Pharmacy Refill Coordination Note  Lindsey Davis is a 63 y.o. female assessed today regarding refills of clinic administered specialty medication(s) Esketamine HCl (Spravato  (84 MG Dose))   Clinic requested Courier to Provider Office   Delivery date: 11/29/24   Verified address: Dr. Jonothan office- [address], [address], [place], [place], ATTN: Lauraine   Medication will be filled on: 11/29/24    Medication will be dispensed same day courier and signature required and REMS authorization information will be added when dispensed.    Appointment is on 11/30/24.

## 2024-11-27 DIAGNOSIS — F332 Major depressive disorder, recurrent severe without psychotic features: Secondary | ICD-10-CM | POA: Diagnosis not present

## 2024-11-29 ENCOUNTER — Other Ambulatory Visit: Payer: Self-pay

## 2024-11-29 NOTE — Progress Notes (Signed)
 Dispensed on 11/29/24, same-day courier, signature required. REMS Authorization Code: [id number].

## 2024-12-03 ENCOUNTER — Other Ambulatory Visit: Payer: Self-pay

## 2024-12-07 ENCOUNTER — Other Ambulatory Visit: Payer: Self-pay

## 2024-12-11 ENCOUNTER — Telehealth: Admitting: Nurse Practitioner

## 2024-12-11 ENCOUNTER — Other Ambulatory Visit (HOSPITAL_BASED_OUTPATIENT_CLINIC_OR_DEPARTMENT_OTHER): Payer: Self-pay

## 2024-12-11 DIAGNOSIS — B001 Herpesviral vesicular dermatitis: Secondary | ICD-10-CM | POA: Diagnosis not present

## 2024-12-11 MED ORDER — VALACYCLOVIR HCL 1 G PO TABS
2000.0000 mg | ORAL_TABLET | Freq: Two times a day (BID) | ORAL | 0 refills | Status: DC
  Filled 2024-12-11: qty 4, 1d supply, fill #0

## 2024-12-11 NOTE — Progress Notes (Signed)
 We are sorry that you are not feeling well.  Here is how we plan to help!  Based on your symptoms, it appears you may have a viral infection.   Cold sores, also known as fever blisters, are small, fluid-filled blisters that typically appear around the mouth. They are caused by the herpes simplex virus, most commonly herpes simplex virus type 1 (HSV-1). The virus spreads through direct skin contact, as well as by sharing items like eating utensils, lip balms, or towels.   Cold sores are contagious until they dry out, which usually takes about 5 to 7 days. Its important to wash your hands frequently, especially after touching the affected area. If you wear contact lenses, avoid handling them after touching a cold sore, as the virus can spread to your eyes and cause complications.   Most people experience pain at the site or tingling sensations in their lips that may begin before the ulcers erupt.  Herpes simplex is treatable but not curable.  It may lie dormant for a long time and then reappear due to stress or prolonged sun exposure.  Many patients have success in treating their cold sores with an over the counter topical called Abreva.  You may apply the cream up to 5 times daily (maximum 10 days) until healing occurs.  To help speed the healing of your cold sore, I have prescribed Valacyclovir  1000 mg -- Take two pills by mouth twice a day for 1 day    HOME CARE:  Wash your hands frequently. Do not pick at or rub the sore. Don't pop the blisters. Avoid kissing other people during this time. Avoid sharing drinking glasses, eating utensils, or razors. Do not handle contact lenses unless you have thoroughly washed your hands with soap and warm water ! Avoid oral sex during this time.  Herpes from sores on your mouth can spread to your partner's genital area. Avoid contact with anyone who has eczema or a weakened immune system. Cold sores are often triggered by exposure to intense sunlight,  use a lip balm containing a sunscreen (SPF 30 or higher).  GET HELP RIGHT AWAY IF:  Blisters look infected -- increased redness around the site, warmth of skin, drainage of pus from around the area. Blisters occur near or in the eye. Symptoms last longer than 10 days. Your symptoms worsen.  MAKE SURE YOU:  Understand these instructions. Will watch your condition. Will get help right away if you are not doing well or get worse.  Your e-visit answers were reviewed by a board certified advanced clinical practitioner to complete your personal care plan.  Depending upon the condition, your plan could have included both over the counter or prescription medications.     Please review your pharmacy choice.  Be sure that the pharmacy you have chosen is open so that you can pick up your prescription now.  If there is a problem, you can message your provider in MyChart to have the prescription routed to another pharmacy.     Your safety is important to us .  If you have drug allergies, check our prescription carefully.   For the next 24 hours you can use MyChart to ask questions about today's visit, request a non-urgent call back, or ask for a work or school excuse from your e-visit provider.   You will receive an email in the next two days asking about your experience.  I hope that your e-visit has been valuable and will speed up your recovery.  I have spent 5 minutes in review of e-visit questionnaire, review and updating patient chart, medical decision making and response to patient.   Kaya Pottenger W Kaleigh Spiegelman, NP

## 2024-12-15 ENCOUNTER — Other Ambulatory Visit (HOSPITAL_BASED_OUTPATIENT_CLINIC_OR_DEPARTMENT_OTHER): Payer: Self-pay

## 2024-12-15 MED ORDER — AMPHET-DEXTROAMPHET 3-BEAD ER 50 MG PO CP24
50.0000 mg | ORAL_CAPSULE | Freq: Every morning | ORAL | 0 refills | Status: AC
  Filled 2024-12-23: qty 90, 90d supply, fill #0

## 2024-12-17 ENCOUNTER — Other Ambulatory Visit: Payer: Self-pay

## 2024-12-20 ENCOUNTER — Telehealth: Admitting: Physician Assistant

## 2024-12-20 DIAGNOSIS — B9689 Other specified bacterial agents as the cause of diseases classified elsewhere: Secondary | ICD-10-CM

## 2024-12-20 DIAGNOSIS — J019 Acute sinusitis, unspecified: Secondary | ICD-10-CM

## 2024-12-21 ENCOUNTER — Other Ambulatory Visit (HOSPITAL_BASED_OUTPATIENT_CLINIC_OR_DEPARTMENT_OTHER): Payer: Self-pay

## 2024-12-21 ENCOUNTER — Other Ambulatory Visit: Payer: Self-pay

## 2024-12-21 MED ORDER — SPRAVATO (84 MG DOSE) 28 MG/DEVICE NA SOPK
PACK | NASAL | 0 refills | Status: DC
  Filled 2024-12-21: qty 3, 7d supply, fill #0

## 2024-12-21 NOTE — Progress Notes (Signed)
 Specialty Pharmacy Refill Coordination Note  Lindsey Davis is a 65 y.o. female contacted today regarding refills of specialty medication(s) Esketamine HCl (Spravato  (84 MG Dose))   Patient requested Courier to Provider Office   Delivery date: 12/22/24   Verified address: Dr. Jonothan office- [address], [place], [place], ATTN: Lauraine   Medication will be filled on: 12/22/24   Same day courier, signature required. REMS Authorization to be added on date dispensed.

## 2024-12-22 ENCOUNTER — Other Ambulatory Visit: Payer: Self-pay

## 2024-12-22 NOTE — Progress Notes (Signed)
"   12/22/24 [facility] authorization [id number] "

## 2024-12-23 ENCOUNTER — Other Ambulatory Visit (HOSPITAL_BASED_OUTPATIENT_CLINIC_OR_DEPARTMENT_OTHER): Payer: Self-pay

## 2024-12-23 ENCOUNTER — Other Ambulatory Visit: Payer: Self-pay

## 2024-12-23 MED ORDER — AMOXICILLIN-POT CLAVULANATE 875-125 MG PO TABS
1.0000 | ORAL_TABLET | Freq: Two times a day (BID) | ORAL | 0 refills | Status: AC
  Filled 2024-12-23: qty 14, 7d supply, fill #0

## 2024-12-23 NOTE — Progress Notes (Signed)

## 2024-12-24 ENCOUNTER — Other Ambulatory Visit (HOSPITAL_COMMUNITY): Payer: Self-pay

## 2024-12-24 ENCOUNTER — Other Ambulatory Visit (HOSPITAL_BASED_OUTPATIENT_CLINIC_OR_DEPARTMENT_OTHER): Payer: Self-pay

## 2024-12-24 ENCOUNTER — Other Ambulatory Visit: Payer: Self-pay

## 2024-12-25 ENCOUNTER — Other Ambulatory Visit (HOSPITAL_COMMUNITY): Payer: Self-pay

## 2024-12-25 MED ORDER — SPRAVATO (84 MG DOSE) 28 MG/DEVICE NA SOPK
3.0000 | PACK | NASAL | 0 refills | Status: AC
  Filled 2024-12-25: qty 3, 30d supply, fill #0
  Filled 2024-12-27: qty 3, 7d supply, fill #0

## 2024-12-26 ENCOUNTER — Other Ambulatory Visit (HOSPITAL_COMMUNITY): Payer: Self-pay

## 2024-12-27 ENCOUNTER — Other Ambulatory Visit (HOSPITAL_COMMUNITY): Payer: Self-pay

## 2024-12-27 ENCOUNTER — Other Ambulatory Visit: Payer: Self-pay

## 2024-12-27 NOTE — Progress Notes (Signed)
 Dispensed on 12/27/24, same day courier, signature required. REMS Authorization Code: [id number].

## 2024-12-27 NOTE — Progress Notes (Signed)
 Specialty Pharmacy Refill Coordination Note  Idonna Heeren is a 64 y.o. female assessed today regarding refills of clinic administered specialty medication(s) Esketamine HCl (Spravato  (84 MG Dose))   Clinic requested Courier to Provider Office   Delivery date: 12/27/24   Verified address: Dr. Jonothan office- [address], [place], [place], ATTN: Lauraine   Medication will be filled on: 12/27/24  REMS information will be added when dispensed.

## 2024-12-27 NOTE — Progress Notes (Signed)
 Same-Day courier (signature required) sending 12/27/2024.  Control Number: [id number]

## 2024-12-28 ENCOUNTER — Telehealth: Admitting: Family Medicine

## 2024-12-28 ENCOUNTER — Encounter

## 2024-12-28 DIAGNOSIS — B9689 Other specified bacterial agents as the cause of diseases classified elsewhere: Secondary | ICD-10-CM

## 2024-12-28 MED ORDER — PREDNISONE 20 MG PO TABS
20.0000 mg | ORAL_TABLET | Freq: Two times a day (BID) | ORAL | 0 refills | Status: AC
  Filled 2024-12-28: qty 10, 5d supply, fill #0

## 2024-12-28 NOTE — Progress Notes (Signed)
 Message sent to patient requesting further input regarding current symptoms. Awaiting patient response.

## 2024-12-28 NOTE — Addendum Note (Signed)
 Addended by: ALMEDA DEGREE on: 12/28/2024 07:29 PM   Modules accepted: Orders

## 2024-12-29 ENCOUNTER — Other Ambulatory Visit (HOSPITAL_BASED_OUTPATIENT_CLINIC_OR_DEPARTMENT_OTHER): Payer: Self-pay

## 2024-12-29 ENCOUNTER — Other Ambulatory Visit: Payer: Self-pay

## 2024-12-30 ENCOUNTER — Telehealth: Admitting: Physician Assistant

## 2024-12-30 DIAGNOSIS — B001 Herpesviral vesicular dermatitis: Secondary | ICD-10-CM | POA: Diagnosis not present

## 2024-12-31 ENCOUNTER — Other Ambulatory Visit: Payer: Self-pay

## 2024-12-31 ENCOUNTER — Other Ambulatory Visit (HOSPITAL_BASED_OUTPATIENT_CLINIC_OR_DEPARTMENT_OTHER): Payer: Self-pay

## 2024-12-31 MED ORDER — VALACYCLOVIR HCL 1 G PO TABS
2000.0000 mg | ORAL_TABLET | Freq: Two times a day (BID) | ORAL | 0 refills | Status: AC
  Filled 2024-12-31: qty 4, 1d supply, fill #0

## 2024-12-31 NOTE — Progress Notes (Signed)
 We are sorry that you are not feeling well.  Here is how we plan to help!  Based on your symptoms, it appears you may have a viral infection.   Cold sores, also known as fever blisters, are small, fluid-filled blisters that typically appear around the mouth. They are caused by the herpes simplex virus, most commonly herpes simplex virus type 1 (HSV-1). The virus spreads through direct skin contact, as well as by sharing items like eating utensils, lip balms, or towels.   Cold sores are contagious until they dry out, which usually takes about 5 to 7 days. Its important to wash your hands frequently, especially after touching the affected area. If you wear contact lenses, avoid handling them after touching a cold sore, as the virus can spread to your eyes and cause complications.   Most people experience pain at the site or tingling sensations in their lips that may begin before the ulcers erupt.  Herpes simplex is treatable but not curable.  It may lie dormant for a long time and then reappear due to stress or prolonged sun exposure.  Many patients have success in treating their cold sores with an over the counter topical called Abreva.  You may apply the cream up to 5 times daily (maximum 10 days) until healing occurs.  To help speed the healing of your cold sore, I have prescribed Valacyclovir  1000 mg -- Take two pills by mouth twice a day for 1 day    HOME CARE:  Wash your hands frequently. Do not pick at or rub the sore. Don't pop the blisters. Avoid kissing other people during this time. Avoid sharing drinking glasses, eating utensils, or razors. Do not handle contact lenses unless you have thoroughly washed your hands with soap and warm water! Avoid oral sex during this time.  Herpes from sores on your mouth can spread to your partner's genital area. Avoid contact with anyone who has eczema or a weakened immune system. Cold sores are often triggered by exposure to intense sunlight,  use a lip balm containing a sunscreen (SPF 30 or higher).  GET HELP RIGHT AWAY IF:  Blisters look infected -- increased redness around the site, warmth of skin, drainage of pus from around the area. Blisters occur near or in the eye. Symptoms last longer than 10 days. Your symptoms worsen.  MAKE SURE YOU:  Understand these instructions. Will watch your condition. Will get help right away if you are not doing well or get worse.  Your e-visit answers were reviewed by a board certified advanced clinical practitioner to complete your personal care plan.  Depending upon the condition, your plan could have included both over the counter or prescription medications.     Please review your pharmacy choice.  Be sure that the pharmacy you have chosen is open so that you can pick up your prescription now.  If there is a problem, you can message your provider in MyChart to have the prescription routed to another pharmacy.     Your safety is important to us .  If you have drug allergies, check our prescription carefully.   For the next 24 hours you can use MyChart to ask questions about today's visit, request a non-urgent call back, or ask for a work or school excuse from your e-visit provider.   You will receive an email in the next two days asking about your experience.  I hope that your e-visit has been valuable and will speed up your recovery.  I have spent 5 minutes in review of e-visit questionnaire, review and updating patient chart, medical decision making and response to patient.   Delon CHRISTELLA Dickinson, PA-C

## 2025-01-03 ENCOUNTER — Other Ambulatory Visit: Payer: Self-pay

## 2025-01-13 ENCOUNTER — Other Ambulatory Visit: Payer: Self-pay

## 2025-01-21 ENCOUNTER — Other Ambulatory Visit: Payer: Self-pay
